# Patient Record
Sex: Male | Born: 1965 | Race: White | Hispanic: No | Marital: Married | Smoking: Former smoker
Health system: Southern US, Community
[De-identification: ages and names within clinical notes are randomized; demographics above are authoritative.]

## PROBLEM LIST (undated history)

## (undated) DIAGNOSIS — D751 Secondary polycythemia: Secondary | ICD-10-CM

## (undated) DIAGNOSIS — C569 Malignant neoplasm of unspecified ovary: Secondary | ICD-10-CM

## (undated) DIAGNOSIS — C801 Malignant (primary) neoplasm, unspecified: Secondary | ICD-10-CM

## (undated) HISTORY — DX: Secondary polycythemia: D75.1

## (undated) HISTORY — DX: Malignant neoplasm of unspecified ovary: C56.9

## (undated) HISTORY — DX: Malignant (primary) neoplasm, unspecified: C80.1

---

## 2017-06-07 DIAGNOSIS — Z6834 Body mass index (BMI) 34.0-34.9, adult: Secondary | ICD-10-CM | POA: Insufficient documentation

## 2017-06-07 DIAGNOSIS — I10 Essential (primary) hypertension: Secondary | ICD-10-CM | POA: Insufficient documentation

## 2017-06-07 DIAGNOSIS — D751 Secondary polycythemia: Secondary | ICD-10-CM | POA: Insufficient documentation

## 2017-06-27 ENCOUNTER — Ambulatory Visit
Admission: RE | Admit: 2017-06-27 | Discharge: 2017-06-27 | Disposition: A | Discharge status: Home / Self Care | Payer: Self-pay | Source: Ambulatory Visit | Attending: Oncology | Admitting: Oncology

## 2017-06-27 ENCOUNTER — Inpatient Hospital Stay: Payer: Self-pay | Attending: Oncology | Admitting: Oncology

## 2017-06-27 ENCOUNTER — Inpatient Hospital Stay: Payer: Self-pay

## 2017-06-27 ENCOUNTER — Encounter: Payer: Self-pay | Admitting: Oncology

## 2017-06-27 ENCOUNTER — Telehealth: Payer: Self-pay | Admitting: *Deleted

## 2017-06-27 ENCOUNTER — Other Ambulatory Visit: Payer: Self-pay | Admitting: *Deleted

## 2017-06-27 VITALS — BP 137/86 | HR 67 | Temp 98.3°F | Ht 72.0 in | Wt 246.9 lb

## 2017-06-27 VITALS — BP 119/87 | HR 109 | Temp 98.0°F | Resp 18

## 2017-06-27 DIAGNOSIS — D751 Secondary polycythemia: Secondary | ICD-10-CM

## 2017-06-27 DIAGNOSIS — R5383 Other fatigue: Secondary | ICD-10-CM | POA: Insufficient documentation

## 2017-06-27 DIAGNOSIS — R531 Weakness: Secondary | ICD-10-CM | POA: Insufficient documentation

## 2017-06-27 DIAGNOSIS — I10 Essential (primary) hypertension: Secondary | ICD-10-CM | POA: Insufficient documentation

## 2017-06-27 DIAGNOSIS — R350 Frequency of micturition: Secondary | ICD-10-CM | POA: Insufficient documentation

## 2017-06-27 DIAGNOSIS — Z87891 Personal history of nicotine dependence: Secondary | ICD-10-CM | POA: Insufficient documentation

## 2017-06-27 DIAGNOSIS — Z7982 Long term (current) use of aspirin: Secondary | ICD-10-CM | POA: Insufficient documentation

## 2017-06-27 DIAGNOSIS — L299 Pruritus, unspecified: Secondary | ICD-10-CM | POA: Insufficient documentation

## 2017-06-27 DIAGNOSIS — E785 Hyperlipidemia, unspecified: Secondary | ICD-10-CM | POA: Insufficient documentation

## 2017-06-27 DIAGNOSIS — Z809 Family history of malignant neoplasm, unspecified: Secondary | ICD-10-CM | POA: Insufficient documentation

## 2017-06-27 DIAGNOSIS — Z8041 Family history of malignant neoplasm of ovary: Secondary | ICD-10-CM | POA: Insufficient documentation

## 2017-06-27 DIAGNOSIS — E291 Testicular hypofunction: Secondary | ICD-10-CM | POA: Insufficient documentation

## 2017-06-27 LAB — CBC WITH DIFFERENTIAL/PLATELET
Basophils Absolute: 0.1 10*3/uL (ref 0–0.1)
Basophils Relative: 1 %
EOS ABS: 0.2 10*3/uL (ref 0–0.7)
EOS PCT: 3 %
HCT: 56.3 % — ABNORMAL HIGH (ref 40.0–52.0)
Hemoglobin: 19.4 g/dL — ABNORMAL HIGH (ref 13.0–18.0)
LYMPHS ABS: 1.9 10*3/uL (ref 1.0–3.6)
Lymphocytes Relative: 26 %
MCH: 27.9 pg (ref 26.0–34.0)
MCHC: 34.5 g/dL (ref 32.0–36.0)
MCV: 80.8 fL (ref 80.0–100.0)
MONOS PCT: 8 %
Monocytes Absolute: 0.6 10*3/uL (ref 0.2–1.0)
Neutro Abs: 4.6 10*3/uL (ref 1.4–6.5)
Neutrophils Relative %: 62 %
PLATELETS: 256 10*3/uL (ref 150–440)
RBC: 6.97 MIL/uL — AB (ref 4.40–5.90)
RDW: 14.4 % (ref 11.5–14.5)
WBC: 7.4 10*3/uL (ref 3.8–10.6)

## 2017-06-27 LAB — URINALYSIS, COMPLETE (UACMP) WITH MICROSCOPIC
BILIRUBIN URINE: NEGATIVE
Bacteria, UA: NONE SEEN
Glucose, UA: NEGATIVE mg/dL
HGB URINE DIPSTICK: NEGATIVE
Ketones, ur: NEGATIVE mg/dL
LEUKOCYTES UA: NEGATIVE
NITRITE: NEGATIVE
Protein, ur: NEGATIVE mg/dL
RBC / HPF: NONE SEEN RBC/hpf (ref 0–5)
SPECIFIC GRAVITY, URINE: 1.01 (ref 1.005–1.030)
Squamous Epithelial / HPF: NONE SEEN
pH: 6 (ref 5.0–8.0)

## 2017-06-27 NOTE — Progress Notes (Signed)
New patient with polycythemia here for initial evaluation. He states that he has severe itching on the back of his neck and arms. He states that he used to take testosterone replacement but he no longer uses it. He denies having any pain. He does not get the Flu Vaccine.

## 2017-06-27 NOTE — Progress Notes (Signed)
Hematology/Oncology Consult note [facility] Telephone:([phone] Fax:([phone]  Patient Care Team: Sofie Hartigan, MD as PCP - General (Family Medicine)   Name of the patient: Jerry Espinoza  [id number]  [birth year]    Reason for referral- Dr. Ellison Hughs   Referring physician- polycythemia  Date of visit: 06/27/17   History of presenting illness- patient is a 51 year old male who has been referred to [place] for evaluation and management of polycythemia. CBC done on 06/23/2017 showed white count of 7.4, H&H of 18.5/52.8 and platelet count 257. One month ago his H&H was 19.6/56.3. His past medical history is significant for hypertension and hyperlipidemia and hypogonadism. He is on testosterone on replacement therapy through a clinic in [place]. His hemoglobin has been between 17-18 units back in 2015. Serum free testosterone level on 06/07/2017 was low at0.89 and total testosterone was low at 37.   Patient states he has been off testosterone replacement for 3 months now. Reports itching mainly at the back of his scalp and over his trunk. Denies any exposure to new soaps or chemicals. He has also been started on BP meds for his HTN. He smoked for 29 years but quit smoking 10 years ago. He wakes up multiple times at night due to frequent urination and snores at night but often gets a restful sleep. He reports about 7-8 pounds of intentional weight loss over last few months  ECOG PS- 0  Pain scale- 0   Review of systems- Review of Systems  Constitutional: Positive for malaise/fatigue. Negative for chills, fever and weight loss.  HENT: Negative for congestion, ear discharge and nosebleeds.   Eyes: Negative for blurred vision.  Respiratory: Negative for cough, hemoptysis, sputum production, shortness of breath and wheezing.   Cardiovascular: Negative for chest pain, palpitations, orthopnea and claudication.  Gastrointestinal: Negative for abdominal pain,  blood in stool, constipation, diarrhea, heartburn, melena, nausea and vomiting.  Genitourinary: Negative for dysuria, flank pain, frequency, hematuria and urgency.  Musculoskeletal: Negative for back pain, joint pain and myalgias.  Skin: Positive for itching and rash.  Neurological: Negative for dizziness, tingling, focal weakness, seizures, weakness and headaches.  Endo/Heme/Allergies: Does not bruise/bleed easily.  Psychiatric/Behavioral: Negative for depression and suicidal ideas. The patient does not have insomnia.     No Known Allergies  Patient Active Problem List   Diagnosis Date Noted  . Hypogonadism male 06/27/2017  . Essential hypertension 06/07/2017  . Polycythemia 06/07/2017     Past Medical History:  Diagnosis Date  . Cancer ([place])   . Ovarian cancer [facility])      History reviewed. No pertinent surgical history.  Social History   Social History  . Marital status: Married    Spouse name: N/A  . Number of children: N/A  . Years of education: N/A   Occupational History  . Not on file.   Social History Main Topics  . Smoking status: Former Smoker    Packs/day: 1.50    Years: 29.00    Quit date: 09/07/2007  . Smokeless tobacco: Never Used  . Alcohol use Not on file  . Drug use: No  . Sexual activity: Not on file   Other Topics Concern  . Not on file   Social History Narrative  . No narrative on file     Family History  Problem Relation Age of Onset  . Cancer Father   . Cancer Sister      Current Outpatient Prescriptions:  .  aspirin EC 81 MG tablet,  Take 81 mg by mouth daily., Disp: , Rfl:  .  lisinopril-hydrochlorothiazide (PRINZIDE,ZESTORETIC) 10-12.5 MG tablet, Take 1 tablet by mouth daily., Disp: , Rfl: 3   Physical exam:  Vitals:   06/27/17 0910 06/27/17 0914  BP:  137/86  Pulse:  67  Temp:  98.3 F (36.8 C)  TempSrc:  Tympanic  Weight: 246 lb 14.6 oz (112 kg) 246 lb 14.6 oz (112 kg)  Height:  6' (1.829 m)   Physical Exam    Constitutional: He is oriented to person, place, and time and well-developed, well-nourished, and in no distress.  Face appears mildly plethoric  HENT:  Head: Normocephalic and atraumatic.  Eyes: Pupils are equal, round, and reactive to light. EOM are normal.  Neck: Normal range of motion.  Cardiovascular: Normal rate, regular rhythm and normal heart sounds.   Pulmonary/Chest: Effort normal and breath sounds normal.  Abdominal: Soft. Bowel sounds are normal.  No palpable splenomegaly  Neurological: He is alert and oriented to person, place, and time.  Skin: Skin is warm and dry.  Erythematous macular rash seen over the trunk.        Assessment and plan- Patient is a 51 y.o. male referred for polycythemia  Patient is currently off testosterone replacement therapy for last 3 months. Serum testosterone levels checked last week were low. His hb continues to be high despite stopping testosterone. I would therefore like to rule out primary polycythemia vera- check cbc with diff, epo level, JAK2 mutation testing. r/o secondary causes CXR (chronic lung disease), tumor associated polycythemia (EPO and UA).   Depending on his H/H today, I will consider doing weekly phlebotomy if hct >50. I will see him back in 2 weeks time to discuss his bloodwork results. If testing for polycythemia vera negative, he will need OSA testing by pcp. He has been referred to endocrinology by PCP for polycythemia  I explained to him difference between primary and secondary polycythemia,e tiology and management   Thank you for this kind referral and the opportunity to participate in the care of this patient  Visit Diagnosis 1. Polycythemia     Dr. Randa Evens, MD, MPH [facility] at [facility] Pager- [phone] 06/27/2017  10:54 AM

## 2017-06-27 NOTE — Telephone Encounter (Signed)
Called the patient and told him that hct is elevated still after he has been off testosterone.  With hct being elevated pt will need phlebtomy this week and next week and he is agreeable.  Today he will come back at 2 pm and we will decide what date next week so her can look at his schedule

## 2017-06-27 NOTE — Patient Instructions (Signed)

## 2017-06-28 LAB — ERYTHROPOIETIN: ERYTHROPOIETIN: 5.6 m[IU]/mL (ref 2.6–18.5)

## 2017-06-30 ENCOUNTER — Encounter: Payer: Self-pay | Admitting: Family Medicine

## 2017-06-30 LAB — JAK2 GENOTYPR

## 2017-07-04 ENCOUNTER — Inpatient Hospital Stay: Payer: Self-pay

## 2017-07-04 DIAGNOSIS — D751 Secondary polycythemia: Secondary | ICD-10-CM

## 2017-07-04 LAB — CBC
HEMATOCRIT: 52.6 % — AB (ref 40.0–52.0)
Hemoglobin: 18.1 g/dL — ABNORMAL HIGH (ref 13.0–18.0)
MCH: 27.9 pg (ref 26.0–34.0)
MCHC: 34.4 g/dL (ref 32.0–36.0)
MCV: 81.1 fL (ref 80.0–100.0)
PLATELETS: 218 10*3/uL (ref 150–440)
RBC: 6.48 MIL/uL — AB (ref 4.40–5.90)
RDW: 14 % (ref 11.5–14.5)
WBC: 7.8 10*3/uL (ref 3.8–10.6)

## 2017-07-11 ENCOUNTER — Inpatient Hospital Stay: Payer: Self-pay

## 2017-07-11 ENCOUNTER — Encounter: Payer: Self-pay | Admitting: Oncology

## 2017-07-11 ENCOUNTER — Other Ambulatory Visit: Payer: Self-pay | Admitting: *Deleted

## 2017-07-11 ENCOUNTER — Inpatient Hospital Stay: Payer: Self-pay | Attending: Oncology | Admitting: Oncology

## 2017-07-11 DIAGNOSIS — Z8041 Family history of malignant neoplasm of ovary: Secondary | ICD-10-CM | POA: Insufficient documentation

## 2017-07-11 DIAGNOSIS — E291 Testicular hypofunction: Secondary | ICD-10-CM | POA: Insufficient documentation

## 2017-07-11 DIAGNOSIS — Z79899 Other long term (current) drug therapy: Secondary | ICD-10-CM | POA: Insufficient documentation

## 2017-07-11 DIAGNOSIS — Z807 Family history of other malignant neoplasms of lymphoid, hematopoietic and related tissues: Secondary | ICD-10-CM | POA: Insufficient documentation

## 2017-07-11 DIAGNOSIS — D751 Secondary polycythemia: Secondary | ICD-10-CM | POA: Insufficient documentation

## 2017-07-11 DIAGNOSIS — I10 Essential (primary) hypertension: Secondary | ICD-10-CM | POA: Insufficient documentation

## 2017-07-11 DIAGNOSIS — R350 Frequency of micturition: Secondary | ICD-10-CM | POA: Insufficient documentation

## 2017-07-11 DIAGNOSIS — L299 Pruritus, unspecified: Secondary | ICD-10-CM | POA: Insufficient documentation

## 2017-07-11 DIAGNOSIS — Z87891 Personal history of nicotine dependence: Secondary | ICD-10-CM | POA: Insufficient documentation

## 2017-07-11 DIAGNOSIS — E785 Hyperlipidemia, unspecified: Secondary | ICD-10-CM | POA: Insufficient documentation

## 2017-07-11 DIAGNOSIS — Z808 Family history of malignant neoplasm of other organs or systems: Secondary | ICD-10-CM | POA: Insufficient documentation

## 2017-07-11 LAB — CBC
HCT: 48.7 % (ref 40.0–52.0)
Hemoglobin: 16.9 g/dL (ref 13.0–18.0)
MCH: 28.3 pg (ref 26.0–34.0)
MCHC: 34.6 g/dL (ref 32.0–36.0)
MCV: 81.6 fL (ref 80.0–100.0)
Platelets: 235 10*3/uL (ref 150–440)
RBC: 5.97 MIL/uL — AB (ref 4.40–5.90)
RDW: 14.3 % (ref 11.5–14.5)
WBC: 10.8 10*3/uL — AB (ref 3.8–10.6)

## 2017-07-11 NOTE — Progress Notes (Signed)
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 CBC done on 06/23/2017 showed white count of 7.4, H&H of 18.5/52.8 and platelet count 257. One month ago his H&H was 19.6/56.3. His past medical history is significant for hypertension and hyperlipidemia and hypogonadism. He is on testosterone on replacement therapy through a clinic in [place]. His hemoglobin has been between 17-18 units back in 2015. Serum free testosterone level on 06/07/2017 was low at0.89 and total testosterone was low at 37.   Patient states he has been off testosterone replacement for 3 months now. Reports itching mainly at the back of his scalp and over his trunk. Denies any exposure to new soaps or chemicals. He has also been started on BP meds for his HTN. He smoked for 29 years but quit smoking 10 years ago. He wakes up multiple times at night due to frequent urination and snores at night but often gets a restful sleep. He reports about 7-8 pounds of intentional weight loss over last few months  Results of blood work from 06/27/2017 were as follows: CBC showed a normal white count and the platelet count and H&H was elevated at 19.4/56.3 respectively.  EPO levels were normal at 5.6.  Jak 2 mutation testing was negative.  Urinalysis did not reveal any evidence of hematuria.  Chest x-ray showed no active cardiopulmonary disease  Given patient's elevated despite stopping testosterone, phlebotomy was initiated on 06/27/2017 and  he has had 2 sessions so far   Interval history- Patient states he has lost 26 pounds of weight in the last 3 weeks although weight on our record has not changed since 06/27/17. Beginning of October he has 257 pounds. 10 pound weight loss but he has also had his tooth pulled in the interim  ECOG PS- 0 Pain scale- 0   Review of systems- Review of Systems  Constitutional: Positive for malaise/fatigue and weight loss. Negative for chills and fever.  HENT: Negative for congestion, ear discharge and nosebleeds.   Eyes: Negative for blurred vision.  Respiratory: Negative for cough, hemoptysis, sputum production, shortness of breath and wheezing.   Cardiovascular: Negative for chest pain, palpitations, orthopnea and claudication.  Gastrointestinal: Negative for abdominal pain, blood in stool, constipation, diarrhea, heartburn, melena, nausea and vomiting.  Genitourinary: Negative for dysuria, flank pain, frequency, hematuria and urgency.  Musculoskeletal: Negative for back pain, joint pain and myalgias.  Skin: Negative for rash.  Neurological: Negative for dizziness, tingling, focal weakness, seizures, weakness and headaches.  Endo/Heme/Allergies: Does not bruise/bleed easily.  Psychiatric/Behavioral: Negative for depression and suicidal ideas. The patient does not have insomnia.       No Known Allergies   Past Medical History:  Diagnosis Date  . Cancer ([place])   . Ovarian cancer ([place])      No past surgical history on file.  Social History   Socioeconomic History  . Marital status: Married    Spouse name: Not on file  . Number of children: Not on file  . Years of education: Not on file  . Highest education  level: Not on file  Social Needs  . Financial resource strain: Not on file  . Food insecurity - worry: Not on file  . Food insecurity - inability: Not on file  . Transportation needs - medical: Not on file  . Transportation needs - non-medical: Not on file  Occupational  History  . Not on file  Tobacco Use  . Smoking status: Former Smoker    Packs/day: 1.50    Years: 29.00    Pack years: 43.50    Last attempt to quit: 09/06/2008    Years since quitting: 8.8  . Smokeless tobacco: Never Used  Substance and Sexual Activity  . Alcohol use: Not on file  . Drug use: No  . Sexual activity: Not on file  Other Topics Concern  . Not on file  Social History Narrative  . Not on file    Family History  Problem Relation Age of Onset  . Cancer Father   . Cancer Sister      Current Outpatient Medications:  .  aspirin EC 81 MG tablet, Take 81 mg by mouth daily., Disp: , Rfl:  .  lisinopril-hydrochlorothiazide (PRINZIDE,ZESTORETIC) 10-12.5 MG tablet, Take 1 tablet by mouth daily., Disp: , Rfl: 3  Physical exam:  Vitals:   07/11/17 0834 07/11/17 0838  BP:  122/83  Pulse:  70  Resp: 14   Temp: 99.7 F (37.6 C)   TempSrc: Tympanic   Weight: 246 lb 7.6 oz (111.8 kg)    Physical Exam  Constitutional: He is oriented to person, place, and time and well-developed, well-nourished, and in no distress.  HENT:  Head: Normocephalic and atraumatic.  Eyes: EOM are normal. Pupils are equal, round, and reactive to light.  Neck: Normal range of motion.  Cardiovascular: Normal rate, regular rhythm and normal heart sounds.  Pulmonary/Chest: Effort normal and breath sounds normal.  Abdominal: Soft. Bowel sounds are normal.  Neurological: He is alert and oriented to person, place, and time.  Skin: Skin is warm and dry.     No flowsheet data found. CBC Latest Ref Rng & Units 07/11/2017  WBC 3.8 - 10.6 K/uL 10.8(H)  Hemoglobin 13.0 - 18.0 g/dL 16.9  Hematocrit 40.0 - 52.0 % 48.7  Platelets 150 - 440 K/uL 235    No images are attached to the encounter.  Dg Chest 2 View  Result Date: 06/27/2017 CLINICAL DATA:  Polycythemia EXAM: CHEST  2 VIEW COMPARISON:  None. FINDINGS: Heart and mediastinal contours are within normal limits. No focal opacities or effusions. No  acute bony abnormality. IMPRESSION: No active cardiopulmonary disease. Electronically Signed   By: Rolm Baptise M.D.   On: 06/27/2017 10:41     Assessment and plan- Patient is a 51 y.o. male referred for polycythemia likely secondary to testosterone replacement therapy  Patient has been off testosterone for about 3 months now and his H&H remains elevated.  Serum testosterone levels were confirmed to be low in October 2018.  I also reviewed the results of the blood work with the patient today which shows a normal EPO level and no evidence of Jak 2 mutation which argues against primary polycythemia.  Chest x-ray was negative for any chronic lung disease and urinalysis did not reveal any hematuria.  Given the normal EPO level tumor associated polycythemia is also unlikely.  At this time I will continue phlebotomy every 2 weeks until the hematocrit is less than 50.  Will repeat CBC with differential and serum testosterone levels in 1 months time  and I will see him back in 3 months with cbc with diff and serum testosterone levels.  If this is truly due to testosterone replacement therapy and patient continues to remain off testosterone, his H&H should normalized with time.   If H&H remains elevated over the next few months despite a low serum testosterone level, I will consider doing a bone marrow biopsy at that time. But he will need sleep apnea evaluation prior  Patient seeing endocrinology next month. If he is re initiated on testosterone, he will need to be started on lowest possible dose to alleviate his symptoms. If his H/H remains >52-54 despite lowest doses of testosterone, I will restart phlebotomy at that time. No indication for phlebotomy today  Patient reports h/o varian carcncer in his sister. Father had lymphoma and paternal uncle has brain cancer. These taken together do not fit any particular genetic pattern. However patient is very worried about family h/o multipel cancers and I will  therefore refer him to genetic counseeling for the same. He will need a screening colonoscopy at this time which should be arranged by his pcp   Visit Diagnosis 1. Polycythemia, secondary      Dr. Randa Evens, MD, MPH [facility] at [facility] Pager- [phone] 07/11/2017 9:27 AM

## 2017-07-11 NOTE — Progress Notes (Signed)
Patient here for follow up with lab results today. He states that he has had a cold for a few days and is very congested. Otherwise, he feels ok.

## 2017-08-08 ENCOUNTER — Other Ambulatory Visit: Payer: Self-pay

## 2017-08-10 ENCOUNTER — Inpatient Hospital Stay: Payer: Self-pay | Attending: Oncology

## 2017-08-10 ENCOUNTER — Telehealth: Payer: Self-pay | Admitting: *Deleted

## 2017-08-10 DIAGNOSIS — D751 Secondary polycythemia: Secondary | ICD-10-CM | POA: Insufficient documentation

## 2017-08-10 LAB — CBC
HEMATOCRIT: 50 % (ref 40.0–52.0)
Hemoglobin: 17.2 g/dL (ref 13.0–18.0)
MCH: 28.6 pg (ref 26.0–34.0)
MCHC: 34.3 g/dL (ref 32.0–36.0)
MCV: 83.5 fL (ref 80.0–100.0)
PLATELETS: 223 10*3/uL (ref 150–440)
RBC: 5.99 MIL/uL — ABNORMAL HIGH (ref 4.40–5.90)
RDW: 14.3 % (ref 11.5–14.5)
WBC: 6.3 10*3/uL (ref 3.8–10.6)

## 2017-08-10 NOTE — Telephone Encounter (Signed)
Called pt and let him know that he did not need phlebotomy today.  Pt has next f/u appt 10/17/2017.  He was thankful for call

## 2017-08-10 NOTE — Telephone Encounter (Signed)
-----   Message from Sindy Guadeloupe, MD sent at 08/10/2017  8:50 AM EST ----- No phlebotomy.

## 2017-08-11 LAB — TESTOSTERONE: Testosterone: 110 ng/dL — ABNORMAL LOW (ref 264–916)

## 2017-10-10 ENCOUNTER — Other Ambulatory Visit: Payer: Self-pay

## 2017-10-10 ENCOUNTER — Ambulatory Visit: Payer: Self-pay | Admitting: Oncology

## 2017-10-17 ENCOUNTER — Encounter: Payer: Self-pay | Admitting: Oncology

## 2017-10-17 ENCOUNTER — Inpatient Hospital Stay: Payer: Self-pay

## 2017-10-17 ENCOUNTER — Inpatient Hospital Stay: Payer: Self-pay | Attending: Oncology | Admitting: Oncology

## 2017-10-17 VITALS — BP 116/95 | HR 75 | Temp 98.5°F | Ht 72.0 in | Wt 249.3 lb

## 2017-10-17 VITALS — BP 134/93 | HR 82 | Temp 97.6°F | Resp 18

## 2017-10-17 DIAGNOSIS — L299 Pruritus, unspecified: Secondary | ICD-10-CM

## 2017-10-17 DIAGNOSIS — I10 Essential (primary) hypertension: Secondary | ICD-10-CM

## 2017-10-17 DIAGNOSIS — R351 Nocturia: Secondary | ICD-10-CM

## 2017-10-17 DIAGNOSIS — Z809 Family history of malignant neoplasm, unspecified: Secondary | ICD-10-CM

## 2017-10-17 DIAGNOSIS — Z7982 Long term (current) use of aspirin: Secondary | ICD-10-CM

## 2017-10-17 DIAGNOSIS — D751 Secondary polycythemia: Secondary | ICD-10-CM

## 2017-10-17 DIAGNOSIS — E291 Testicular hypofunction: Secondary | ICD-10-CM

## 2017-10-17 DIAGNOSIS — Z79899 Other long term (current) drug therapy: Secondary | ICD-10-CM

## 2017-10-17 DIAGNOSIS — R5383 Other fatigue: Secondary | ICD-10-CM

## 2017-10-17 DIAGNOSIS — E785 Hyperlipidemia, unspecified: Secondary | ICD-10-CM

## 2017-10-17 DIAGNOSIS — Z87891 Personal history of nicotine dependence: Secondary | ICD-10-CM

## 2017-10-17 LAB — CBC
HCT: 49.5 % (ref 40.0–52.0)
HEMOGLOBIN: 17.4 g/dL (ref 13.0–18.0)
MCH: 29.6 pg (ref 26.0–34.0)
MCHC: 35.2 g/dL (ref 32.0–36.0)
MCV: 84 fL (ref 80.0–100.0)
PLATELETS: 244 10*3/uL (ref 150–440)
RBC: 5.89 MIL/uL (ref 4.40–5.90)
RDW: 12.9 % (ref 11.5–14.5)
WBC: 6.7 10*3/uL (ref 3.8–10.6)

## 2017-10-17 NOTE — Progress Notes (Signed)
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 CBC done on 06/23/2017 showed white count of 7.4, H&H of 18.5/52.8 and platelet count 257. One month ago his H&H was 19.6/56.3. His past medical history is significant for hypertension and hyperlipidemia and hypogonadism. He is on testosterone on replacement therapy through a clinic in [place]. His hemoglobin has been between 17-18 units back in 2015. Serum free testosterone level on 06/07/2017 was low at0.89 and total testosterone was low at 37.  Patient states he has been off testosterone replacement for 3 months now. Reports itching mainly at the back of his scalp and over his trunk. Denies any exposure to new soaps or chemicals. He has also been started on BP meds for his HTN. He smoked for 29 years but quit smoking 10 years ago. He wakes up multiple times at night due to frequent urination and snores at night but often gets a restful sleep. He reports about 7-8 pounds of intentional weight loss over last few months  Results of blood work from 06/27/2017 were as follows: CBC showed a normal white count and the platelet count and H&H was elevated at 19.4/56.3 respectively.  EPO levels were normal at 5.6.  Jak 2 mutation testing was negative.  Urinalysis did not reveal any evidence of hematuria.  Chest x-ray showed no active cardiopulmonary disease  Given patient's elevated despite stopping testosterone,  phlebotomy was initiated on 06/27/2017 and he has had 2 sessions so far   Interval history- reports mild fatigue. Denies other complaints. He has not seen endocrinology yet. Remains off testosterone  ECOG PS- 0 Pain scale- 0   Review of systems- Review of Systems  Constitutional: Positive for malaise/fatigue. Negative for chills, fever and weight loss.  HENT: Negative for congestion, ear discharge and nosebleeds.   Eyes: Negative for blurred vision.  Respiratory: Negative for cough, hemoptysis, sputum production, shortness of breath and wheezing.   Cardiovascular: Negative for chest pain, palpitations, orthopnea and claudication.  Gastrointestinal: Negative for abdominal pain, blood in stool, constipation, diarrhea, heartburn, melena, nausea and vomiting.  Genitourinary: Negative for dysuria, flank pain, frequency, hematuria and urgency.  Musculoskeletal: Negative for back pain, joint pain and myalgias.  Skin: Negative for rash.  Neurological: Negative for dizziness, tingling, focal weakness, seizures, weakness and headaches.  Endo/Heme/Allergies: Does not bruise/bleed easily.  Psychiatric/Behavioral: Negative for depression and suicidal ideas. The patient does not have insomnia.       No Known Allergies   Past Medical History:  Diagnosis Date  . Cancer ([place])   . Ovarian cancer [facility])      History reviewed. No pertinent surgical history.  Social History   Socioeconomic History  . Marital status: Married    Spouse name: Not on file  . Number of children: Not on file  . Years of education: Not on file  . Highest education level: Not on file  Social Needs  . Financial resource strain: Not on file  . Food insecurity - worry: Not on file  . Food  insecurity - inability: Not on file  . Transportation needs - medical: Not on file  . Transportation needs - non-medical: Not on file  Occupational History  . Not on file  Tobacco Use  . Smoking status: Former Smoker     Packs/day: 1.50    Years: 29.00    Pack years: 43.50    Last attempt to quit: 09/06/2008    Years since quitting: 9.1  . Smokeless tobacco: Never Used  Substance and Sexual Activity  . Alcohol use: Not on file  . Drug use: No  . Sexual activity: Not on file  Other Topics Concern  . Not on file  Social History Narrative  . Not on file    Family History  Problem Relation Age of Onset  . Cancer Father   . Cancer Sister      Current Outpatient Medications:  .  aspirin EC 81 MG tablet, Take 81 mg by mouth daily., Disp: , Rfl:  .  lisinopril-hydrochlorothiazide (PRINZIDE,ZESTORETIC) 10-12.5 MG tablet, Take 1 tablet by mouth daily., Disp: , Rfl: 3  Physical exam:  Vitals:   10/17/17 0829  BP: (!) 116/95  Pulse: 75  Temp: 98.5 F (36.9 C)  TempSrc: Oral  Weight: 249 lb 5.4 oz (113.1 kg)  Height: 6' (1.829 m)   Physical Exam  Constitutional: He is oriented to person, place, and time and well-developed, well-nourished, and in no distress.  HENT:  Head: Normocephalic and atraumatic.  Eyes: EOM are normal. Pupils are equal, round, and reactive to light.  Neck: Normal range of motion.  Cardiovascular: Normal rate, regular rhythm and normal heart sounds.  Pulmonary/Chest: Effort normal and breath sounds normal.  Abdominal: Soft. Bowel sounds are normal.  Neurological: He is alert and oriented to person, place, and time.  Skin: Skin is warm and dry.     No flowsheet data found. CBC Latest Ref Rng & Units 10/17/2017  WBC 3.8 - 10.6 K/uL 6.7  Hemoglobin 13.0 - 18.0 g/dL 17.4  Hematocrit 40.0 - 52.0 % 49.5  Platelets 150 - 440 K/uL 244    No images are attached to the encounter.  No results found.   Assessment and plan- Patient is a 52 y.o. 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  Today's hematocrit is 49.5 and he is borderline for need of phlebotomy.  I will however proceed  with phlebotomy today.  Hopefully he would not need further phlebotomy sessions in the future now that he has been off testosterone for a while.  Repeat CBC in 6 weeks and 12 weeks and I will see him back in 12 weeks for possible phlebotomy.  His E Po levels were normal and Jak 2 mutation was negative and he therefore does not have primary polycythemia vera  I will also touch base with endocrinology regarding his referral that was made by his primary care doctor.  If patient needs to be on testosterone replacements in the future he may need phlebotomies more often   Visit Diagnosis 1. Polycythemia, secondary      Dr. Randa Evens, MD, MPH [facility] at [facility] Pager- [phone] 10/17/2017 8:45 AM

## 2017-10-17 NOTE — Progress Notes (Signed)
No new changes

## 2017-10-17 NOTE — Addendum Note (Signed)
Addended by: Luella Cook on: 10/17/2017 09:01 AM   Modules accepted: Orders

## 2017-10-17 NOTE — Patient Instructions (Signed)

## 2017-10-18 LAB — TESTOSTERONE: TESTOSTERONE: 98 ng/dL — AB (ref 264–916)

## 2017-11-28 ENCOUNTER — Inpatient Hospital Stay: Payer: Self-pay | Admitting: Oncology

## 2017-11-28 ENCOUNTER — Inpatient Hospital Stay: Payer: Self-pay | Attending: Oncology

## 2017-11-28 ENCOUNTER — Inpatient Hospital Stay: Payer: Self-pay

## 2017-11-28 DIAGNOSIS — D751 Secondary polycythemia: Secondary | ICD-10-CM | POA: Insufficient documentation

## 2017-11-28 LAB — CBC
HCT: 48.7 % (ref 40.0–52.0)
HEMOGLOBIN: 16.8 g/dL (ref 13.0–18.0)
MCH: 29.2 pg (ref 26.0–34.0)
MCHC: 34.4 g/dL (ref 32.0–36.0)
MCV: 84.9 fL (ref 80.0–100.0)
Platelets: 230 10*3/uL (ref 150–440)
RBC: 5.73 MIL/uL (ref 4.40–5.90)
RDW: 12.8 % (ref 11.5–14.5)
WBC: 5.8 10*3/uL (ref 3.8–10.6)

## 2017-12-29 IMAGING — CR DG CHEST 2V
2 series · 2 of 2 positions shown · non-contrast
Comparison: None.

CLINICAL DATA: Polycythemia

EXAM:
CHEST  2 VIEW

[chest pa]
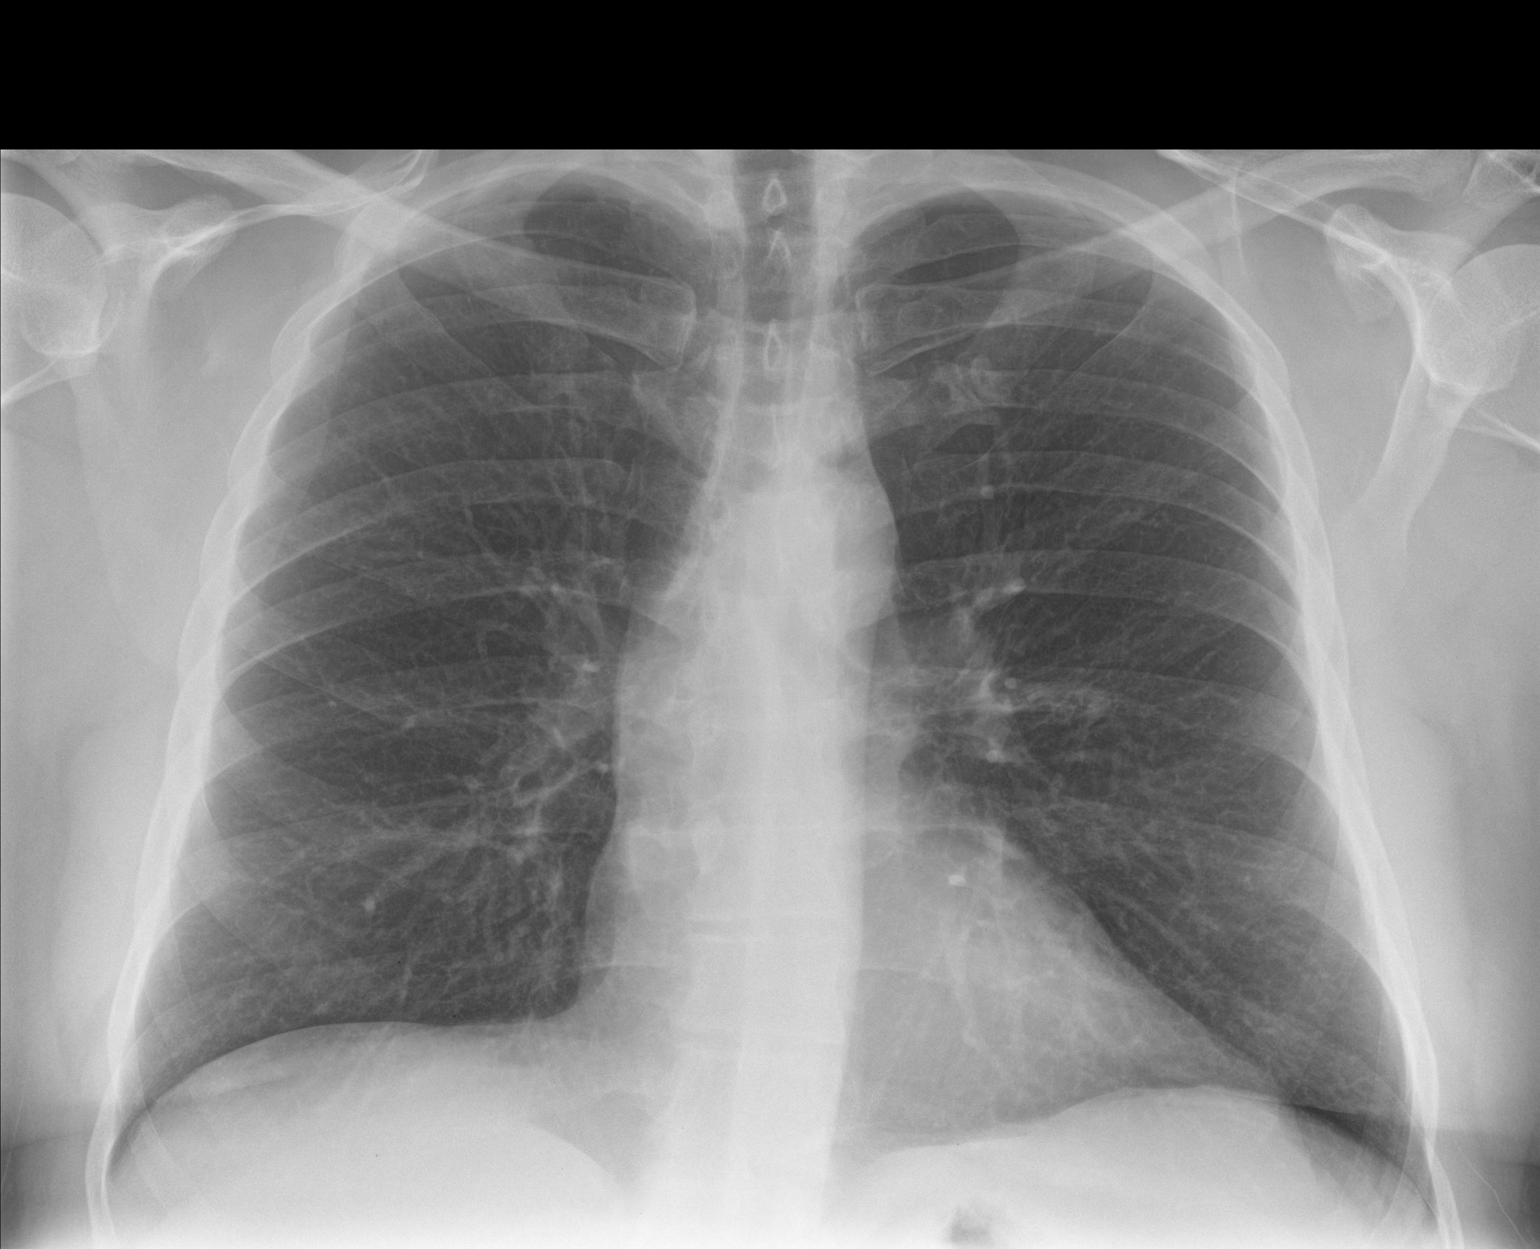

[chest lat]
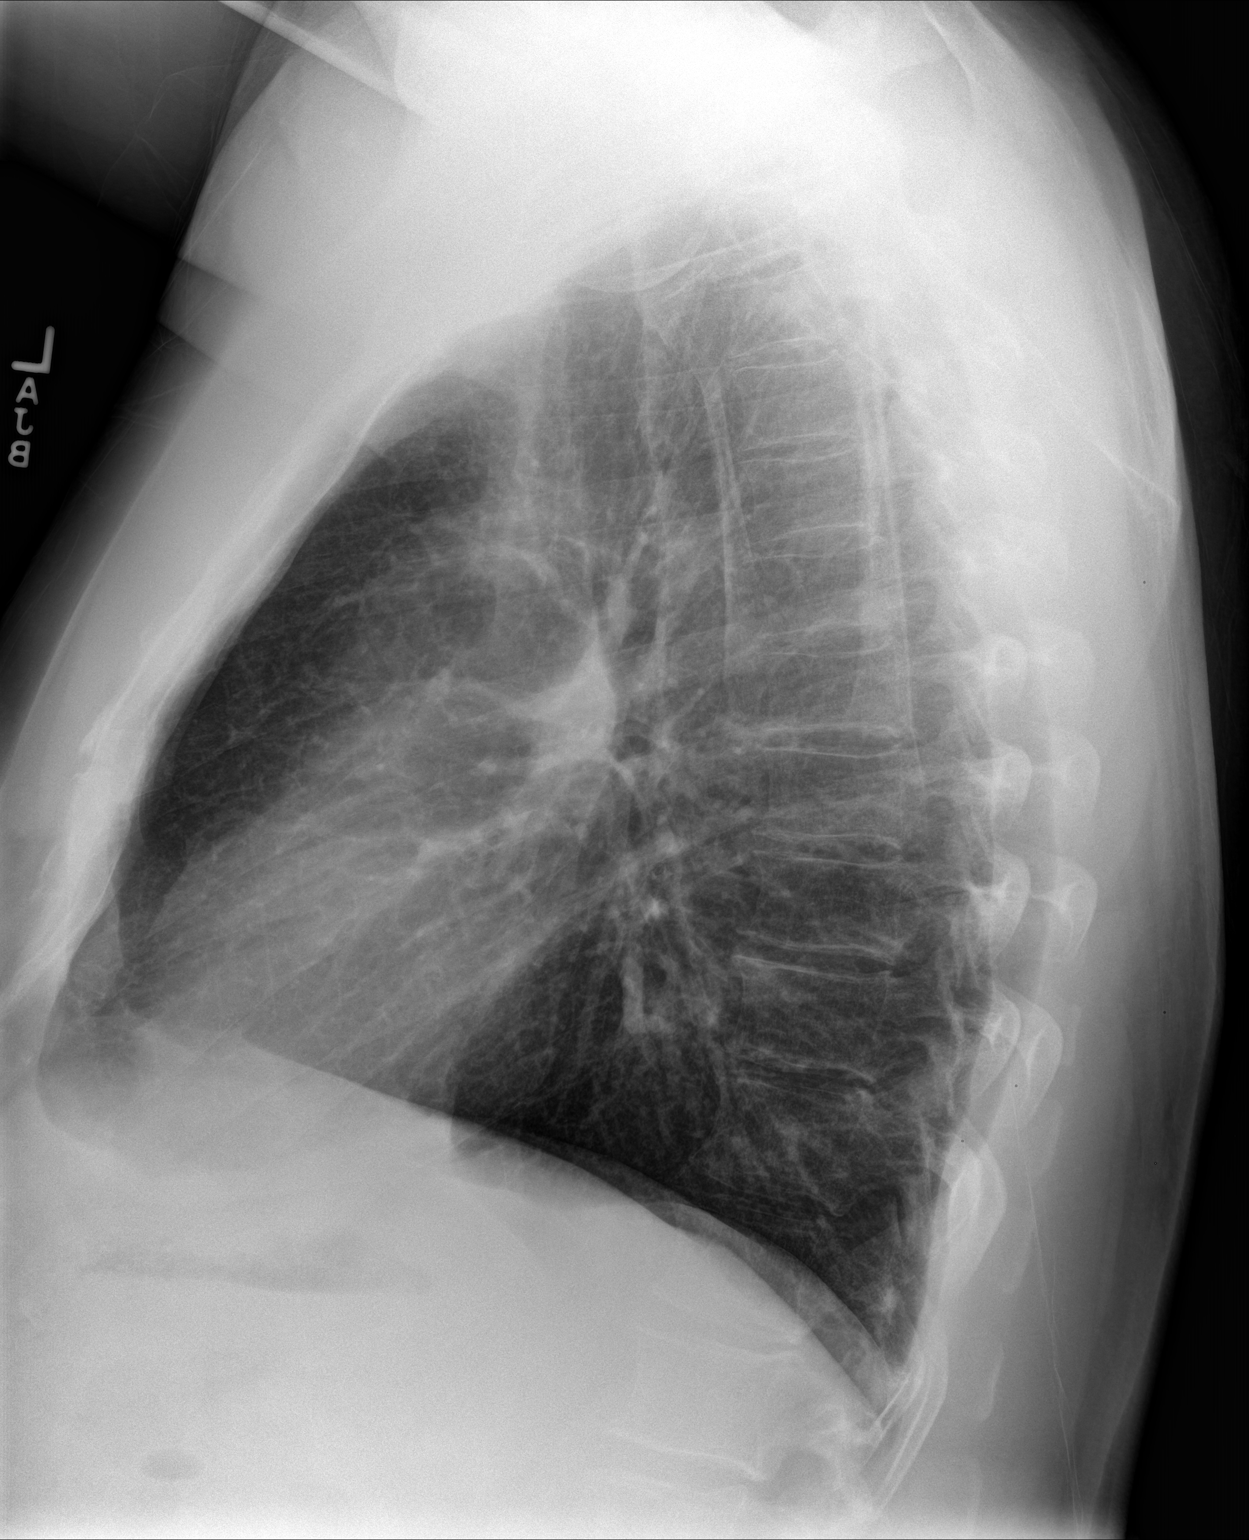

[2 of 2 positions shown; findings below may reference images not displayed]

FINDINGS: Heart and mediastinal contours are within normal limits. No focal
opacities or effusions. No acute bony abnormality.
IMPRESSION: No active cardiopulmonary disease.

## 2018-01-09 ENCOUNTER — Encounter: Payer: Self-pay | Admitting: Oncology

## 2018-01-09 ENCOUNTER — Inpatient Hospital Stay: Payer: Self-pay | Attending: Oncology | Admitting: Oncology

## 2018-01-09 ENCOUNTER — Inpatient Hospital Stay: Payer: Self-pay

## 2018-01-09 ENCOUNTER — Telehealth: Payer: Self-pay | Admitting: *Deleted

## 2018-01-09 VITALS — BP 120/86 | HR 74 | Temp 97.8°F | Resp 18 | Ht 72.0 in | Wt 250.6 lb

## 2018-01-09 DIAGNOSIS — R51 Headache: Secondary | ICD-10-CM | POA: Insufficient documentation

## 2018-01-09 DIAGNOSIS — E785 Hyperlipidemia, unspecified: Secondary | ICD-10-CM | POA: Insufficient documentation

## 2018-01-09 DIAGNOSIS — Z87891 Personal history of nicotine dependence: Secondary | ICD-10-CM | POA: Insufficient documentation

## 2018-01-09 DIAGNOSIS — Z809 Family history of malignant neoplasm, unspecified: Secondary | ICD-10-CM | POA: Insufficient documentation

## 2018-01-09 DIAGNOSIS — Z79899 Other long term (current) drug therapy: Secondary | ICD-10-CM | POA: Insufficient documentation

## 2018-01-09 DIAGNOSIS — E291 Testicular hypofunction: Secondary | ICD-10-CM | POA: Insufficient documentation

## 2018-01-09 DIAGNOSIS — D751 Secondary polycythemia: Secondary | ICD-10-CM | POA: Insufficient documentation

## 2018-01-09 DIAGNOSIS — R351 Nocturia: Secondary | ICD-10-CM | POA: Insufficient documentation

## 2018-01-09 DIAGNOSIS — T387X5D Adverse effect of androgens and anabolic congeners, subsequent encounter: Secondary | ICD-10-CM | POA: Insufficient documentation

## 2018-01-09 DIAGNOSIS — Z7982 Long term (current) use of aspirin: Secondary | ICD-10-CM | POA: Insufficient documentation

## 2018-01-09 DIAGNOSIS — I10 Essential (primary) hypertension: Secondary | ICD-10-CM | POA: Insufficient documentation

## 2018-01-09 LAB — CBC
HCT: 50.6 % (ref 40.0–52.0)
HEMOGLOBIN: 17.3 g/dL (ref 13.0–18.0)
MCH: 28.8 pg (ref 26.0–34.0)
MCHC: 34.1 g/dL (ref 32.0–36.0)
MCV: 84.4 fL (ref 80.0–100.0)
Platelets: 257 10*3/uL (ref 150–440)
RBC: 6 MIL/uL — AB (ref 4.40–5.90)
RDW: 13.2 % (ref 11.5–14.5)
WBC: 6.8 10*3/uL (ref 3.8–10.6)

## 2018-01-09 NOTE — Progress Notes (Signed)
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 CBC done on 06/23/2017 showed white count of 7.4, H&H of 18.5/52.8 and platelet count 257. One month ago his H&H was 19.6/56.3. His past medical history is significant for hypertension and hyperlipidemia and hypogonadism. He is on testosterone on replacement therapy through a clinic in [place]. His hemoglobin has been between 17-18 units back in 2015. Serum free testosterone level on 06/07/2017 was low at0.89 and total testosterone was low at 37.  Patient states he has been off testosterone replacement for 3 months now. Reports itching mainly at the back of his scalp and over his trunk. Denies any exposure to new soaps or chemicals. He has also been started on BP meds for his HTN. He smoked for 29 years but quit smoking 10 years ago. He wakes up multiple times at night due to frequent urination and snores at night but often gets a restful sleep. He reports about 7-8 pounds of intentional weight loss over last few months  Results of blood work from 06/27/2017 were as follows: CBC showed a normal white count and the platelet count and H&H was elevated at 19.4/56.3 respectively.EPOlevels were normal at 5.6. Jak 2 mutation testing was negative. Urinalysis did not reveal any evidence of hematuria. Chest x-ray showed no active cardiopulmonary disease  Given patient's elevated despite stopping testosterone,  phlebotomy was initiated on 06/27/2017  Interval history- continues to have on and off headaches which makes it difficult for him to sleep. This is being worked up by Dr. Ellison Hughs. Reports itching in his extremities is better but has scalp itching. He remains off testosterone  ECOG PS- 0 Pain scale- 0   Review of systems- Review of Systems  Constitutional: Negative for chills, fever, malaise/fatigue and weight loss.  HENT: Negative for congestion, ear discharge and nosebleeds.   Eyes: Negative for blurred vision.  Respiratory: Negative for cough, hemoptysis, sputum production, shortness of breath and wheezing.   Cardiovascular: Negative for chest pain, palpitations, orthopnea and claudication.  Gastrointestinal: Negative for abdominal pain, blood in stool, constipation, diarrhea, heartburn, melena, nausea and vomiting.  Genitourinary: Negative for dysuria, flank pain, frequency, hematuria and urgency.  Musculoskeletal: Negative for back pain, joint pain and myalgias.  Skin: Positive for itching. Negative for rash.  Neurological: Positive for headaches. Negative for dizziness, tingling, focal weakness, seizures and weakness.  Endo/Heme/Allergies: Does not bruise/bleed easily.  Psychiatric/Behavioral: Negative for depression and suicidal ideas. The patient does not have insomnia.      No Known Allergies   Past Medical History:  Diagnosis Date  . Cancer ([place])   . Ovarian cancer ([place])      No past surgical history on file.  Social History   Socioeconomic History  . Marital status: Married    Spouse name: Not on file  . Number of children: Not on file  . Years of education: Not on file  . Highest education level: Not on file  Occupational History  . Not on file  Social Needs  . Emergency planning/management officer  strain: Not on file  . Food insecurity:    Worry: Not on file    Inability: Not on file  . Transportation needs:    Medical: Not on file    Non-medical: Not on file  Tobacco  Use  . Smoking status: Former Smoker    Packs/day: 1.50    Years: 29.00    Pack years: 43.50    Last attempt to quit: 09/06/2008    Years since quitting: 9.3  . Smokeless tobacco: Never Used  Substance and Sexual Activity  . Alcohol use: Not on file  . Drug use: No  . Sexual activity: Not on file  Lifestyle  . Physical activity:    Days per week: Not on file    Minutes per session: Not on file  . Stress: Not on file  Relationships  . Social connections:    Talks on phone: Not on file    Gets together: Not on file    Attends religious service: Not on file    Active member of club or organization: Not on file    Attends meetings of clubs or organizations: Not on file    Relationship status: Not on file  . Intimate partner violence:    Fear of current or ex partner: Not on file    Emotionally abused: Not on file    Physically abused: Not on file    Forced sexual activity: Not on file  Other Topics Concern  . Not on file  Social History Narrative  . Not on file    Family History  Problem Relation Age of Onset  . Cancer Father   . Cancer Sister      Current Outpatient Medications:  .  aspirin EC 81 MG tablet, Take 81 mg by mouth daily., Disp: , Rfl:  .  lisinopril-hydrochlorothiazide (PRINZIDE,ZESTORETIC) 10-12.5 MG tablet, Take 1 tablet by mouth daily., Disp: , Rfl: 3  Physical exam:  Vitals:   01/09/18 0833  BP: 120/86  Pulse: 74  Resp: 18  Temp: 97.8 F (36.6 C)  TempSrc: Tympanic  SpO2: 97%  Weight: 250 lb 8.8 oz (113.6 kg)  Height: 6' (1.829 m)   Physical Exam  Constitutional: He is oriented to person, place, and time. He appears well-developed and well-nourished.  HENT:  Head: Normocephalic and atraumatic.  Eyes: Pupils are equal, round, and reactive to light. EOM are normal.  Neck: Normal range of motion.  Cardiovascular: Normal rate, regular rhythm and normal heart sounds.  Pulmonary/Chest: Effort normal and breath sounds normal.  Abdominal: Soft.  Bowel sounds are normal.  Neurological: He is alert and oriented to person, place, and time.  Skin: Skin is warm and dry.     No flowsheet data found. CBC Latest Ref Rng & Units 01/09/2018  WBC 3.8 - 10.6 K/uL 6.8  Hemoglobin 13.0 - 18.0 g/dL 17.3  Hematocrit 40.0 - 52.0 % 50.6  Platelets 150 - 440 K/uL 257      Assessment and plan- Patient is a 52 y.o. male with testosterone induced secondary polycythemia  Hematocrit today is borderline high at 50.6. He remains off testosterone. He has not seen endocrinology yet. I will hold off on phlebotomy today. Repeat cbc in 2 and 4 moths. If there is a persistent increase in his hematocrit, I will consider doing bone marrow biopsy to rule out myeloproliferative disorder despite negative JAK2. I would like Dr. Ellison Hughs to consider evaluating him for OSA with a formal sleep study at this time. He has  not smoked for last several years.   Headaches- being worked up by [employer]. Consider MRI brain and neurology referal if symptoms persist. Patient will be talking to his pcp about this   Visit Diagnosis 1. Polycythemia, secondary      Dr. Randa Evens, MD, MPH [facility] [phone] 01/09/2018 9:06 AM

## 2018-01-09 NOTE — Telephone Encounter (Signed)
Called Dr.  Quita Skye office [phone] and asked if pt could be evaluated for OSA.  I spoke to [place] and she made pt appt. For 5/10 at 11:20.  I called pt and left him a message that he has an appt with PCP and if he can't make it to please call PCP office

## 2018-03-06 ENCOUNTER — Telehealth: Payer: Self-pay

## 2018-03-06 ENCOUNTER — Inpatient Hospital Stay: Payer: Self-pay

## 2018-03-06 ENCOUNTER — Inpatient Hospital Stay: Payer: Self-pay | Attending: Oncology

## 2018-03-06 DIAGNOSIS — D751 Secondary polycythemia: Secondary | ICD-10-CM

## 2018-03-06 LAB — CBC
HCT: 47.2 % (ref 40.0–52.0)
Hemoglobin: 16.5 g/dL (ref 13.0–18.0)
MCH: 29.3 pg (ref 26.0–34.0)
MCHC: 34.9 g/dL (ref 32.0–36.0)
MCV: 83.9 fL (ref 80.0–100.0)
PLATELETS: 237 10*3/uL (ref 150–440)
RBC: 5.62 MIL/uL (ref 4.40–5.90)
RDW: 13.2 % (ref 11.5–14.5)
WBC: 6 10*3/uL (ref 3.8–10.6)

## 2018-03-06 NOTE — Telephone Encounter (Signed)
Spoke with Jerry Espinoza to inform him that No need for phlebotomy.hold off on bone marrow biopsy at this time. To keep schedule appointment (05/01/18) The patient was agreeable and understanding

## 2018-03-06 NOTE — Telephone Encounter (Signed)
-----  Message from Sindy Guadeloupe, MD sent at 03/06/2018 10:25 AM EDT ----- H/H 16.5/47.2. No need for phlebotomy.hold off on bone marrow biopsy at this time

## 2018-05-01 ENCOUNTER — Inpatient Hospital Stay: Payer: Self-pay

## 2018-05-01 ENCOUNTER — Inpatient Hospital Stay: Payer: Self-pay | Attending: Oncology | Admitting: Oncology

## 2018-05-01 ENCOUNTER — Encounter: Payer: Self-pay | Admitting: Oncology

## 2018-05-01 VITALS — BP 137/86 | HR 76 | Temp 97.6°F | Resp 18 | Ht 72.0 in | Wt 266.8 lb

## 2018-05-01 DIAGNOSIS — I10 Essential (primary) hypertension: Secondary | ICD-10-CM | POA: Insufficient documentation

## 2018-05-01 DIAGNOSIS — R5383 Other fatigue: Secondary | ICD-10-CM | POA: Insufficient documentation

## 2018-05-01 DIAGNOSIS — D751 Secondary polycythemia: Secondary | ICD-10-CM | POA: Insufficient documentation

## 2018-05-01 DIAGNOSIS — Z7982 Long term (current) use of aspirin: Secondary | ICD-10-CM | POA: Insufficient documentation

## 2018-05-01 DIAGNOSIS — L299 Pruritus, unspecified: Secondary | ICD-10-CM | POA: Insufficient documentation

## 2018-05-01 DIAGNOSIS — E291 Testicular hypofunction: Secondary | ICD-10-CM | POA: Insufficient documentation

## 2018-05-01 DIAGNOSIS — E785 Hyperlipidemia, unspecified: Secondary | ICD-10-CM | POA: Insufficient documentation

## 2018-05-01 DIAGNOSIS — Z79899 Other long term (current) drug therapy: Secondary | ICD-10-CM | POA: Insufficient documentation

## 2018-05-01 DIAGNOSIS — R351 Nocturia: Secondary | ICD-10-CM | POA: Insufficient documentation

## 2018-05-01 DIAGNOSIS — Z87891 Personal history of nicotine dependence: Secondary | ICD-10-CM | POA: Insufficient documentation

## 2018-05-01 DIAGNOSIS — Z8041 Family history of malignant neoplasm of ovary: Secondary | ICD-10-CM | POA: Insufficient documentation

## 2018-05-01 LAB — CBC
HEMATOCRIT: 48.4 % (ref 40.0–52.0)
HEMOGLOBIN: 16.4 g/dL (ref 13.0–18.0)
MCH: 29 pg (ref 26.0–34.0)
MCHC: 33.9 g/dL (ref 32.0–36.0)
MCV: 85.3 fL (ref 80.0–100.0)
Platelets: 243 10*3/uL (ref 150–440)
RBC: 5.67 MIL/uL (ref 4.40–5.90)
RDW: 13.4 % (ref 11.5–14.5)
WBC: 6.2 10*3/uL (ref 3.8–10.6)

## 2018-05-01 NOTE — Progress Notes (Signed)
No new changes noted today 

## 2018-05-01 NOTE — Progress Notes (Signed)
Hematology/Oncology Consult note [facility]  Telephone:([phone] Fax:([phone]  Patient Care Team: Sofie Hartigan, MD as PCP - General (Family Medicine)   Name of the patient: Jerry Espinoza  [id number]  1966-06-21   Date of visit: 05/01/18  Diagnosis-polycythemia secondary to testosterone use.  Patient is currently off testosterone  Chief complaint/ Reason for visit-routine follow-up of polycythemia  Heme/Onc history: patient is a 52 year old male who has been referred to [place] for evaluation and management of polycythemia. CBC done on 06/23/2017 showed white count of 7.4, H&H of 18.5/52.8 and platelet count 257. One month ago his H&H was 19.6/56.3. His past medical history is significant for hypertension and hyperlipidemia and hypogonadism. He is on testosterone on replacement therapy through a clinic in [place]. His hemoglobin has been between 17-18 units back in 2015. Serum free testosterone level on 06/07/2017 was low at0.89 and total testosterone was low at 37.  Patient states he has been off testosterone replacement for 3 months now. Reports itching mainly at the back of his scalp and over his trunk. Denies any exposure to new soaps or chemicals. He has also been started on BP meds for his HTN. He smoked for 29 years but quit smoking 10 years ago. He wakes up multiple times at night due to frequent urination and snores at night but often gets a restful sleep. He reports about 7-8 pounds of intentional weight loss over last few months  Results of blood work from 06/27/2017 were as follows: CBC showed a normal white count and the platelet count and H&H was elevated at 19.4/56.3 respectively.EPOlevels were normal at 5.6. Jak 2 mutation testing was negative. Urinalysis did not reveal any evidence of hematuria. Chest x-ray showed no active cardiopulmonary disease  Given patient's elevated despite stopping testosterone, phlebotomy was  initiated on 06/27/2017  Interval history-patient continues to remain off testosterone and does not report feeling overtly fatigued.  He has not required phlebotomy for over 4 months now.  During his last visit he was complaining of frequent headaches and was prescribed nasal spray which has been helping him.  ECOG PS- 0 Pain scale- 0 Opioid associated constipation- no  Review of systems- Review of Systems  Constitutional: Positive for malaise/fatigue. Negative for chills, fever and weight loss.  HENT: Negative for congestion, ear discharge and nosebleeds.   Eyes: Negative for blurred vision.  Respiratory: Negative for cough, hemoptysis, sputum production, shortness of breath and wheezing.   Cardiovascular: Negative for chest pain, palpitations, orthopnea and claudication.  Gastrointestinal: Negative for abdominal pain, blood in stool, constipation, diarrhea, heartburn, melena, nausea and vomiting.  Genitourinary: Negative for dysuria, flank pain, frequency, hematuria and urgency.  Musculoskeletal: Negative for back pain, joint pain and myalgias.  Skin: Negative for rash.  Neurological: Negative for dizziness, tingling, focal weakness, seizures, weakness and headaches.  Endo/Heme/Allergies: Does not bruise/bleed easily.  Psychiatric/Behavioral: Negative for depression and suicidal ideas. The patient does not have insomnia.       No Known Allergies   Past Medical History:  Diagnosis Date  . Cancer ([place])   . Ovarian cancer [facility])      History reviewed. No pertinent surgical history.  Social History   Socioeconomic History  . Marital status: Married    Spouse name: Not on file  . Number of children: Not on file  . Years of education: Not on file  . Highest education level: Not on file  Occupational History  . Not on file  Social Needs  .  Financial resource strain: Not on file  . Food insecurity:    Worry: Not on file    Inability: Not on file  . Transportation needs:     Medical: Not on file    Non-medical: Not on file  Tobacco Use  . Smoking status: Former Smoker    Packs/day: 1.50    Years: 29.00    Pack years: 43.50    Last attempt to quit: 09/06/2008    Years since quitting: 9.6  . Smokeless tobacco: Never Used  Substance and Sexual Activity  . Alcohol use: Not on file  . Drug use: No  . Sexual activity: Not on file  Lifestyle  . Physical activity:    Days per week: Not on file    Minutes per session: Not on file  . Stress: Not on file  Relationships  . Social connections:    Talks on phone: Not on file    Gets together: Not on file    Attends religious service: Not on file    Active member of club or organization: Not on file    Attends meetings of clubs or organizations: Not on file    Relationship status: Not on file  . Intimate partner violence:    Fear of current or ex partner: Not on file    Emotionally abused: Not on file    Physically abused: Not on file    Forced sexual activity: Not on file  Other Topics Concern  . Not on file  Social History Narrative  . Not on file    Family History  Problem Relation Age of Onset  . Cancer Father   . Cancer Sister      Current Outpatient Medications:  .  aspirin EC 81 MG tablet, Take 81 mg by mouth daily., Disp: , Rfl:  .  lisinopril-hydrochlorothiazide (PRINZIDE,ZESTORETIC) 10-12.5 MG tablet, Take 1 tablet by mouth daily., Disp: , Rfl: 3  Physical exam:  Vitals:   05/01/18 0837  BP: 137/86  Pulse: 76  Resp: 18  Temp: 97.6 F (36.4 C)  TempSrc: Oral  SpO2: 98%  Weight: 266 lb 12.1 oz (121 kg)  Height: 6' (1.829 m)   Physical Exam  Constitutional: He is oriented to person, place, and time. He appears well-developed and well-nourished.  HENT:  Head: Normocephalic and atraumatic.  Eyes: Pupils are equal, round, and reactive to light. EOM are normal.  Neck: Normal range of motion.  Cardiovascular: Normal rate, regular rhythm and normal heart sounds.  Pulmonary/Chest:  Effort normal and breath sounds normal.  Abdominal: Soft. Bowel sounds are normal.  Neurological: He is alert and oriented to person, place, and time.  Skin: Skin is warm and dry.     No flowsheet data found. CBC Latest Ref Rng & Units 05/01/2018  WBC 3.8 - 10.6 K/uL 6.2  Hemoglobin 13.0 - 18.0 g/dL 16.4  Hematocrit 40.0 - 52.0 % 48.4  Platelets 150 - 440 K/uL 243      Assessment and plan- Patient is a 52 y.o. male with testosterone induced secondary polycythemia  Patient's hematocrit is less than 50 today and he does not require a phlebotomy at this time.  If his polycythemia recurs it would be worthwhile to rule out obstructive sleep apnea as a cause of polycythemia since patient is now off testosterone for a while.  Repeat CBC in 2 months in 4 months and I will see him back in 4 months.  Threshold for phlebotomy at this time is a hematocrit  between 50-55   Visit Diagnosis 1. Polycythemia, secondary      Dr. Randa Evens, MD, MPH [facility] [phone] 05/01/2018 11:59 AM

## 2018-07-03 ENCOUNTER — Inpatient Hospital Stay: Payer: Self-pay

## 2018-07-04 ENCOUNTER — Inpatient Hospital Stay: Payer: Self-pay

## 2018-07-04 ENCOUNTER — Inpatient Hospital Stay: Payer: Self-pay | Attending: Oncology

## 2018-07-04 DIAGNOSIS — D751 Secondary polycythemia: Secondary | ICD-10-CM | POA: Insufficient documentation

## 2018-07-04 LAB — CBC
HCT: 46.7 % (ref 39.0–52.0)
Hemoglobin: 16.1 g/dL (ref 13.0–17.0)
MCH: 29.1 pg (ref 26.0–34.0)
MCHC: 34.5 g/dL (ref 30.0–36.0)
MCV: 84.3 fL (ref 80.0–100.0)
Platelets: 244 10*3/uL (ref 150–400)
RBC: 5.54 MIL/uL (ref 4.22–5.81)
RDW: 12 % (ref 11.5–15.5)
WBC: 5.4 10*3/uL (ref 4.0–10.5)
nRBC: 0 % (ref 0.0–0.2)

## 2018-07-06 ENCOUNTER — Other Ambulatory Visit: Payer: Self-pay

## 2018-09-04 ENCOUNTER — Inpatient Hospital Stay: Payer: Self-pay

## 2018-09-04 ENCOUNTER — Inpatient Hospital Stay: Payer: Self-pay | Attending: Oncology | Admitting: Nurse Practitioner

## 2019-12-06 ENCOUNTER — Other Ambulatory Visit: Payer: Self-pay | Admitting: *Deleted

## 2019-12-06 DIAGNOSIS — D751 Secondary polycythemia: Secondary | ICD-10-CM

## 2019-12-10 ENCOUNTER — Encounter: Payer: Self-pay | Admitting: Oncology

## 2019-12-10 NOTE — Progress Notes (Signed)
Patient is coming in for follow up he is doing well no complaints  

## 2019-12-11 ENCOUNTER — Inpatient Hospital Stay (HOSPITAL_BASED_OUTPATIENT_CLINIC_OR_DEPARTMENT_OTHER): Payer: Self-pay | Admitting: Oncology

## 2019-12-11 ENCOUNTER — Inpatient Hospital Stay: Payer: Self-pay | Attending: Oncology

## 2019-12-11 ENCOUNTER — Other Ambulatory Visit: Payer: Self-pay

## 2019-12-11 ENCOUNTER — Inpatient Hospital Stay: Payer: Self-pay

## 2019-12-11 VITALS — BP 130/95 | HR 105

## 2019-12-11 VITALS — BP 142/101 | HR 96 | Temp 96.5°F | Ht 72.0 in | Wt 287.0 lb

## 2019-12-11 DIAGNOSIS — Z79890 Hormone replacement therapy (postmenopausal): Secondary | ICD-10-CM

## 2019-12-11 DIAGNOSIS — Z79899 Other long term (current) drug therapy: Secondary | ICD-10-CM | POA: Insufficient documentation

## 2019-12-11 DIAGNOSIS — D751 Secondary polycythemia: Secondary | ICD-10-CM

## 2019-12-11 DIAGNOSIS — R531 Weakness: Secondary | ICD-10-CM | POA: Insufficient documentation

## 2019-12-11 DIAGNOSIS — R5383 Other fatigue: Secondary | ICD-10-CM | POA: Insufficient documentation

## 2019-12-11 DIAGNOSIS — Z87891 Personal history of nicotine dependence: Secondary | ICD-10-CM | POA: Insufficient documentation

## 2019-12-11 LAB — CBC
HCT: 55.4 % — ABNORMAL HIGH (ref 39.0–52.0)
Hemoglobin: 19.1 g/dL — ABNORMAL HIGH (ref 13.0–17.0)
MCH: 28.8 pg (ref 26.0–34.0)
MCHC: 34.5 g/dL (ref 30.0–36.0)
MCV: 83.6 fL (ref 80.0–100.0)
Platelets: 265 10*3/uL (ref 150–400)
RBC: 6.63 MIL/uL — ABNORMAL HIGH (ref 4.22–5.81)
RDW: 13.1 % (ref 11.5–15.5)
WBC: 8 10*3/uL (ref 4.0–10.5)
nRBC: 0 % (ref 0.0–0.2)

## 2019-12-12 ENCOUNTER — Telehealth: Payer: Self-pay

## 2019-12-12 LAB — TESTOSTERONE: Testosterone: 980 ng/dL — ABNORMAL HIGH (ref 264–916)

## 2019-12-12 NOTE — Telephone Encounter (Signed)
Called patient but had to leave him a detailed message letting him know that his testosterone level was elevated. I called patient's PCP-Dr. Marya Landry to let them know that patient's testosterone level was elevated and to make adjustments and to call me back in case they had any further questions.

## 2019-12-12 NOTE — Telephone Encounter (Signed)
-----   Message from Sindy Guadeloupe, MD sent at 12/12/2019 10:40 AM EDT ----- Please let patient and the provider who is prescribing him testosterone know- his dose needs to be decreased. Patient was not able to recall the providers name

## 2019-12-12 NOTE — Telephone Encounter (Signed)
Spoke with patient about his testosterone results and relayed the previous message from [place]. Patient verbalized understanding and no further questions

## 2019-12-13 NOTE — Progress Notes (Signed)
Hematology/Oncology Consult note [facility]  Telephone:([phone] [phone]  Patient Care Team: Sofie Hartigan, MD as PCP - General (Family Medicine)   Name of the patient: Jerry Espinoza  [id number]  01-27-1966   Date of visit: 12/13/19  Diagnosis-secondary polycythemia from testosterone replacement therapy  Chief complaint/ Reason for visit-routine follow-up of polycythemia lost to follow-up  Heme/Onc history: patient is a 54 year old male who has been referred to [place] for evaluation and management of polycythemia. CBC done on 06/23/2017 showed white count of 7.4, H&H of 18.5/52.8 and platelet count 257. One month ago his H&H was 19.6/56.3. His past medical history is significant for hypertension and hyperlipidemia and hypogonadism. He is on testosterone on replacement therapy through a clinic in [place]. His hemoglobin has been between 17-18 units back in 2015. Serum free testosterone level on 06/07/2017 was low at0.89 and total testosterone was low at 37.  Results of blood work from 06/27/2017 were as follows: CBC showed a normal white count and the platelet count and H&H was elevated at 19.4/56.3 respectively.EPOlevels were normal at 5.6. Jak 2 mutation testing was negative. Urinalysis did not reveal any evidence of hematuria. Chest x-ray showed no active cardiopulmonary disease  Interval history-patient was last seen by me in August 2019 and was subsequently lost to follow-up.  He has come to reestablish follow-up.  He is back on testosterone replacements which he gets through some external clinic.  He cannot recollect who his provider is.  Currently reports significant fatigue but denies other complaints  ECOG PS- 1 Pain scale- 0   Review of systems- Review of Systems  Constitutional: Positive for malaise/fatigue. Negative for chills, fever and weight loss.  HENT: Negative for congestion, ear discharge and nosebleeds.   Eyes:  Negative for blurred vision.  Respiratory: Negative for cough, hemoptysis, sputum production, shortness of breath and wheezing.   Cardiovascular: Negative for chest pain, palpitations, orthopnea and claudication.  Gastrointestinal: Negative for abdominal pain, blood in stool, constipation, diarrhea, heartburn, melena, nausea and vomiting.  Genitourinary: Negative for dysuria, flank pain, frequency, hematuria and urgency.  Musculoskeletal: Negative for back pain, joint pain and myalgias.  Skin: Negative for rash.  Neurological: Negative for dizziness, tingling, focal weakness, seizures, weakness and headaches.  Endo/Heme/Allergies: Does not bruise/bleed easily.  Psychiatric/Behavioral: Negative for depression and suicidal ideas. The patient does not have insomnia.       No Known Allergies   Past Medical History:  Diagnosis Date  . Cancer ([place])   . Ovarian cancer [facility])      History reviewed. No pertinent surgical history.  Social History   Socioeconomic History  . Marital status: Married    Spouse name: Not on file  . Number of children: Not on file  . Years of education: Not on file  . Highest education level: Not on file  Occupational History  . Not on file  Tobacco Use  . Smoking status: Former Smoker    Packs/day: 1.50    Years: 29.00    Pack years: 43.50    Quit date: 09/06/2008    Years since quitting: 11.2  . Smokeless tobacco: Never Used  Substance and Sexual Activity  . Alcohol use: Not on file  . Drug use: No  . Sexual activity: Not on file  Other Topics Concern  . Not on file  Social History Narrative  . Not on file   Social Determinants of Health   Financial Resource Strain:   . Difficulty of  Paying Living Expenses:   Food Insecurity:   . Worried About Charity fundraiser in the Last Year:   . Arboriculturist in the Last Year:   Transportation Needs:   . Film/video editor (Medical):   Marland Kitchen Lack of Transportation (Non-Medical):   Physical  Activity:   . Days of Exercise per Week:   . Minutes of Exercise per Session:   Stress:   . Feeling of Stress :   Social Connections:   . Frequency of Communication with Friends and Family:   . Frequency of Social Gatherings with Friends and Family:   . Attends Religious Services:   . Active Member of Clubs or Organizations:   . Attends Archivist Meetings:   Marland Kitchen Marital Status:   Intimate Partner Violence:   . Fear of Current or Ex-Partner:   . Emotionally Abused:   Marland Kitchen Physically Abused:   . Sexually Abused:     Family History  Problem Relation Age of Onset  . Cancer Father   . Cancer Sister      Current Outpatient Medications:  .  hydrochlorothiazide (HYDRODIURIL) 25 MG tablet, Take 25 mg by mouth daily., Disp: , Rfl:  .  losartan (COZAAR) 50 MG tablet, TAKE 1 TABLET BY MOUTH EVERY DAY, Disp: , Rfl:   Physical exam:  Vitals:   12/11/19 1121  BP: (!) 142/101  Pulse: 96  Temp: (!) 96.5 F (35.8 C)  TempSrc: Tympanic  SpO2: 97%  Weight: 287 lb (130.2 kg)  Height: 6' (1.829 m)   Physical Exam Constitutional:      General: He is not in acute distress. HENT:     Head: Normocephalic and atraumatic.  Eyes:     Pupils: Pupils are equal, round, and reactive to light.  Cardiovascular:     Rate and Rhythm: Normal rate and regular rhythm.     Heart sounds: Normal heart sounds.  Pulmonary:     Effort: Pulmonary effort is normal.     Breath sounds: Normal breath sounds.  Abdominal:     General: Bowel sounds are normal.     Palpations: Abdomen is soft.  Musculoskeletal:     Cervical back: Normal range of motion.  Skin:    General: Skin is warm and dry.  Neurological:     Mental Status: He is alert and oriented to person, place, and time.      No flowsheet data found. CBC Latest Ref Rng & Units 12/11/2019  WBC 4.0 - 10.5 K/uL 8.0  Hemoglobin 13.0 - 17.0 g/dL 19.1(H)  Hematocrit 39.0 - 52.0 % 55.4(H)  Platelets 150 - 400 K/uL 265     Assessment and  plan- Patient is a 54 y.o. male with secondary polycythemia from testosterone replacement therapy  Patient's H&H is significantly elevated at 19.1/55.4.  He will therefore proceed with phlebotomy at this time and he will continue to get weekly phlebotomies 500 cc until hematocrit is less than 50.  I have highly encouraged the patient to speak to the provider who is giving him testosterone to make sure that he is at the lowest possible dose of testosterone that would alleviate his symptoms.  If he continues to remain on high doses of testosterone he will likely need frequent phlebotomies.  Testosterone replacement therapy also increases the risk of thromboembolic events and prostate cancer.  I will check his PSA level at his next labs in 1 week.  I have also asked the patient to give me information  regarding the provider was prescribing him testosterone replacements since it is not entirely clear if this is coming through his primary care doctor.  He should ideally follow-up with urology or endocrinology for this  Patient's testosterone levels are significantly elevated at 980.  I therefore feel that there is definite room to go down on the dose of his testosterone.  Patient verbalized understanding.  Patient will continue to get weekly phlebotomies until hematocrit is less than 50.  I will tentatively see him back in 3 months time with CBC with differential for possible phlebotomy   Visit Diagnosis 1. Polycythemia, secondary   2. Long-term current use of testosterone replacement therapy      Dr. Randa Evens, MD, MPH [facility] [id number] 12/13/2019 11:51 AM

## 2019-12-19 ENCOUNTER — Inpatient Hospital Stay: Payer: Self-pay

## 2019-12-19 ENCOUNTER — Other Ambulatory Visit: Payer: Self-pay

## 2019-12-19 VITALS — BP 125/87 | HR 92 | Temp 96.1°F | Wt 291.6 lb

## 2019-12-19 DIAGNOSIS — D751 Secondary polycythemia: Secondary | ICD-10-CM

## 2019-12-19 LAB — HEMOGLOBIN AND HEMATOCRIT, BLOOD
HCT: 50.4 % (ref 39.0–52.0)
Hemoglobin: 17.4 g/dL — ABNORMAL HIGH (ref 13.0–17.0)

## 2019-12-19 LAB — PSA: Prostatic Specific Antigen: 1.12 ng/mL (ref 0.00–4.00)

## 2019-12-26 ENCOUNTER — Telehealth: Payer: Self-pay | Admitting: *Deleted

## 2019-12-26 ENCOUNTER — Inpatient Hospital Stay: Payer: Self-pay

## 2019-12-26 NOTE — Telephone Encounter (Signed)
Got a secure chat from [place] that pt can't come in today because can't get off of work. He had phlebotomy last week. And he was callin to see what psa was 1.12 and that is normal. When I called and spoke to him he does not think he will need to get phlebotomy next time since he already had a phlebtomy but he will come to his next scheduled appt and get labs to make decision, he was mostly calling to get psa level

## 2020-01-02 ENCOUNTER — Inpatient Hospital Stay: Payer: Self-pay

## 2020-01-09 ENCOUNTER — Inpatient Hospital Stay: Payer: Self-pay

## 2020-01-09 ENCOUNTER — Inpatient Hospital Stay: Payer: Self-pay | Attending: Oncology

## 2020-01-16 ENCOUNTER — Inpatient Hospital Stay: Payer: Self-pay

## 2020-01-23 ENCOUNTER — Inpatient Hospital Stay: Payer: Self-pay

## 2020-01-30 ENCOUNTER — Inpatient Hospital Stay: Payer: Self-pay

## 2020-02-06 ENCOUNTER — Inpatient Hospital Stay: Payer: Self-pay | Attending: Oncology

## 2020-02-06 ENCOUNTER — Inpatient Hospital Stay: Payer: Self-pay

## 2020-02-13 ENCOUNTER — Inpatient Hospital Stay: Payer: Self-pay

## 2020-02-20 ENCOUNTER — Inpatient Hospital Stay: Payer: Self-pay

## 2020-02-21 ENCOUNTER — Encounter: Payer: Self-pay | Admitting: Oncology

## 2020-02-21 ENCOUNTER — Telehealth: Payer: Self-pay | Admitting: Oncology

## 2020-02-21 NOTE — Telephone Encounter (Signed)
Per review of patient's chart, patient has missed weekly lab and phlebotomy appts since 01-02-20. Per [place] MD request, weekly appts have been cancelled. Next appt with [facility] remains scheduled for 03-18-20. Letter sent to patient informing that patient's next appt is scheduled for 03-18-20 and to phone the [facility] with any questions.

## 2020-02-27 ENCOUNTER — Inpatient Hospital Stay: Payer: Self-pay

## 2020-03-05 ENCOUNTER — Inpatient Hospital Stay: Payer: Self-pay

## 2020-03-12 ENCOUNTER — Inpatient Hospital Stay: Payer: Self-pay

## 2020-03-18 ENCOUNTER — Other Ambulatory Visit: Payer: Self-pay

## 2020-03-18 ENCOUNTER — Telehealth: Payer: Self-pay | Admitting: Oncology

## 2020-03-18 ENCOUNTER — Encounter: Payer: Self-pay | Admitting: Oncology

## 2020-03-18 ENCOUNTER — Inpatient Hospital Stay: Payer: Self-pay | Admitting: Oncology

## 2020-03-18 NOTE — Telephone Encounter (Signed)
Patient did not attend appt on this date. Writer phoned patient and left voicemail requesting return call to reschedule. Writer also mailed letter to patient requesting patient Engineer, maintenance to reschedule.

## 2020-04-17 ENCOUNTER — Inpatient Hospital Stay: Payer: Self-pay

## 2020-04-17 ENCOUNTER — Inpatient Hospital Stay: Payer: Self-pay | Attending: Oncology

## 2020-04-17 ENCOUNTER — Inpatient Hospital Stay (HOSPITAL_BASED_OUTPATIENT_CLINIC_OR_DEPARTMENT_OTHER): Payer: Self-pay | Admitting: Oncology

## 2020-04-17 ENCOUNTER — Other Ambulatory Visit: Payer: Self-pay

## 2020-04-17 ENCOUNTER — Encounter: Payer: Self-pay | Admitting: Oncology

## 2020-04-17 ENCOUNTER — Encounter (INDEPENDENT_AMBULATORY_CARE_PROVIDER_SITE_OTHER): Payer: Self-pay

## 2020-04-17 VITALS — BP 152/96 | HR 83 | Temp 98.6°F | Resp 16 | Ht 72.0 in | Wt 293.9 lb

## 2020-04-17 VITALS — BP 141/91 | HR 88

## 2020-04-17 DIAGNOSIS — D751 Secondary polycythemia: Secondary | ICD-10-CM

## 2020-04-17 DIAGNOSIS — R531 Weakness: Secondary | ICD-10-CM | POA: Insufficient documentation

## 2020-04-17 DIAGNOSIS — R5383 Other fatigue: Secondary | ICD-10-CM | POA: Insufficient documentation

## 2020-04-17 DIAGNOSIS — Z87891 Personal history of nicotine dependence: Secondary | ICD-10-CM | POA: Insufficient documentation

## 2020-04-17 LAB — CBC
HCT: 56.2 % — ABNORMAL HIGH (ref 39.0–52.0)
Hemoglobin: 18.7 g/dL — ABNORMAL HIGH (ref 13.0–17.0)
MCH: 26.9 pg (ref 26.0–34.0)
MCHC: 33.3 g/dL (ref 30.0–36.0)
MCV: 80.7 fL (ref 80.0–100.0)
Platelets: 213 10*3/uL (ref 150–400)
RBC: 6.96 MIL/uL — ABNORMAL HIGH (ref 4.22–5.81)
RDW: 14.5 % (ref 11.5–15.5)
WBC: 6.1 10*3/uL (ref 4.0–10.5)
nRBC: 0 % (ref 0.0–0.2)

## 2020-04-17 NOTE — Progress Notes (Signed)
Pt eats and drinks good, has goo BM. He gets tired when he has high hct. He has reduced his dose of testosterone to 1/2 ml- not sure what that equates  far mg. Pt feels like he will decrease the testosterone to help get hct lower.

## 2020-04-17 NOTE — Progress Notes (Signed)
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 CBC done on 06/23/2017 showed white count of 7.4, H&H of 18.5/52.8 and platelet count 257. One month ago his H&H was 19.6/56.3. His past medical history is significant for hypertension and hyperlipidemia and hypogonadism. He is on testosterone on replacement therapy through a clinic in [place]. His hemoglobin has been between 17-18 units back in 2015. Serum free testosterone level on 06/07/2017 was low at0.89 and total testosterone was low at 37.  Results of blood work from 06/27/2017 were as follows: CBC showed a normal white count and the platelet count and H&H was elevated at 19.4/56.3 respectively.EPOlevels were normal at 5.6. Jak 2 mutation testing was negative. Urinalysis did not reveal any evidence of hematuria. Chest x-ray showed no active cardiopulmonary disease   Interval history-patient continues to remain on testosterone but was recently found to have elevated hemoglobin and hematocrit and is here to reestablish follow-up for phlebotomy.  Reports ongoing fatigue.  He has decided to cut back on the dose of testosterone to half and maybe eventually consider coming off it altogether. ECOG PS- 1 Pain scale- 0  Review of systems- Review of Systems  Constitutional: Positive for malaise/fatigue. Negative for chills, fever and weight loss.  HENT: Negative for congestion, ear discharge and nosebleeds.   Eyes: Negative for  blurred vision.  Respiratory: Negative for cough, hemoptysis, sputum production, shortness of breath and wheezing.   Cardiovascular: Negative for chest pain, palpitations, orthopnea and claudication.  Gastrointestinal: Negative for abdominal pain, blood in stool, constipation, diarrhea, heartburn, melena, nausea and vomiting.  Genitourinary: Negative for dysuria, flank pain, frequency, hematuria and urgency.  Musculoskeletal: Negative for back pain, joint pain and myalgias.  Skin: Negative for rash.  Neurological: Negative for dizziness, tingling, focal weakness, seizures, weakness and headaches.  Endo/Heme/Allergies: Does not bruise/bleed easily.  Psychiatric/Behavioral: Negative for depression and suicidal ideas. The patient does not have insomnia.       No Known Allergies   Past Medical History:  Diagnosis Date  . Cancer ([place])   . Ovarian cancer ([place])   . Polycythemia, secondary      History reviewed. No pertinent surgical history.  Social History   Socioeconomic History  . Marital status: Married    Spouse name: Not on file  . Number of children: Not on file  . Years of education: Not on file  . Highest education level: Not on file  Occupational History  . Not on file  Tobacco Use  . Smoking status: Former Smoker    Packs/day: 1.50    Years: 29.00    Pack years: 43.50    Quit date: 09/06/2008    Years since quitting: 11.6  . Smokeless tobacco: Never Used  Vaping Use  . Vaping Use: Never used  Substance and Sexual Activity  . Alcohol use: Not on file  . Drug use: No  . Sexual activity: Not on file  Other Topics Concern  . Not on file  Social History Narrative  . Not on file   Social Determinants of Health  Financial Resource Strain:   . Difficulty of Paying Living Expenses:   Food Insecurity:   . Worried About Charity fundraiser in the Last Year:   . Arboriculturist in the Last Year:   Transportation Needs:   . Film/video editor (Medical):   Marland Kitchen  Lack of Transportation (Non-Medical):   Physical Activity:   . Days of Exercise per Week:   . Minutes of Exercise per Session:   Stress:   . Feeling of Stress :   Social Connections:   . Frequency of Communication with Friends and Family:   . Frequency of Social Gatherings with Friends and Family:   . Attends Religious Services:   . Active Member of Clubs or Organizations:   . Attends Archivist Meetings:   Marland Kitchen Marital Status:   Intimate Partner Violence:   . Fear of Current or Ex-Partner:   . Emotionally Abused:   Marland Kitchen Physically Abused:   . Sexually Abused:     Family History  Problem Relation Age of Onset  . Cancer Father   . Cancer Sister      Current Outpatient Medications:  .  hydrochlorothiazide (HYDRODIURIL) 25 MG tablet, Take 25 mg by mouth daily., Disp: , Rfl:  .  losartan (COZAAR) 50 MG tablet, TAKE 1 TABLET BY MOUTH EVERY DAY, Disp: , Rfl:  .  TESTOSTERONE CYPIONATE IJ, Inject 1 Dose as directed 2 (two) times a week., Disp: , Rfl:   Physical exam:  Vitals:   04/17/20 1128  BP: (!) 152/96  Pulse: 83  Resp: 16  Temp: 98.6 F (37 C)  TempSrc: Oral  Weight: 293 lb 14.4 oz (133.3 kg)  Height: 6' (1.829 m)   Physical Exam Constitutional:      General: He is not in acute distress.    Comments: Face appears plethoric  Pulmonary:     Effort: Pulmonary effort is normal.  Skin:    General: Skin is warm and dry.  Neurological:     Mental Status: He is alert and oriented to person, place, and time.      No flowsheet data found. CBC Latest Ref Rng & Units 04/17/2020  WBC 4.0 - 10.5 K/uL 6.1  Hemoglobin 13.0 - 17.0 g/dL 18.7(H)  Hematocrit 39 - 52 % 56.2(H)  Platelets 150 - 400 K/uL 213     Assessment and plan- Patient is a 54 y.o. 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  He will therefore proceed with weekly phlebotomy at this time with a  goal hematocrit of less than 50.  Patient understands that as long as he is on testosterone he is likely going to have secondary polycythemia for which she will continue to receive phlebotomy.  Patient is considering coming off testosterone replacement therapy.  After his hematocrit come down to less than 50 we will check his H&H every 2 months and I will see him back in 4 months for possible phlebotomy.  I will check a testosterone level as well at his next lab   Visit Diagnosis 1. Polycythemia, secondary      Dr. Randa Evens, MD, MPH [facility] at [facility] [phone] 04/17/2020 4:14 PM

## 2020-04-23 ENCOUNTER — Inpatient Hospital Stay: Payer: Self-pay

## 2020-04-23 ENCOUNTER — Other Ambulatory Visit: Payer: Self-pay

## 2020-04-23 VITALS — BP 158/97 | HR 72 | Temp 99.1°F | Resp 16

## 2020-04-23 DIAGNOSIS — D751 Secondary polycythemia: Secondary | ICD-10-CM

## 2020-04-23 LAB — CBC WITH DIFFERENTIAL/PLATELET
Abs Immature Granulocytes: 0.06 10*3/uL (ref 0.00–0.07)
Basophils Absolute: 0.1 10*3/uL (ref 0.0–0.1)
Basophils Relative: 1 %
Eosinophils Absolute: 0.4 10*3/uL (ref 0.0–0.5)
Eosinophils Relative: 7 %
HCT: 50.6 % (ref 39.0–52.0)
Hemoglobin: 17.1 g/dL — ABNORMAL HIGH (ref 13.0–17.0)
Immature Granulocytes: 1 %
Lymphocytes Relative: 27 %
Lymphs Abs: 1.7 10*3/uL (ref 0.7–4.0)
MCH: 27.1 pg (ref 26.0–34.0)
MCHC: 33.8 g/dL (ref 30.0–36.0)
MCV: 80.3 fL (ref 80.0–100.0)
Monocytes Absolute: 0.8 10*3/uL (ref 0.1–1.0)
Monocytes Relative: 12 %
Neutro Abs: 3.5 10*3/uL (ref 1.7–7.7)
Neutrophils Relative %: 52 %
Platelets: 218 10*3/uL (ref 150–400)
RBC: 6.3 MIL/uL — ABNORMAL HIGH (ref 4.22–5.81)
RDW: 13.7 % (ref 11.5–15.5)
WBC: 6.6 10*3/uL (ref 4.0–10.5)
nRBC: 0 % (ref 0.0–0.2)

## 2020-04-24 ENCOUNTER — Inpatient Hospital Stay: Payer: Self-pay

## 2020-04-24 LAB — TESTOSTERONE: Testosterone: 389 ng/dL (ref 264–916)

## 2020-04-25 ENCOUNTER — Inpatient Hospital Stay: Payer: Self-pay

## 2020-05-01 ENCOUNTER — Inpatient Hospital Stay: Payer: Self-pay

## 2020-05-01 ENCOUNTER — Other Ambulatory Visit: Payer: Self-pay

## 2020-05-01 VITALS — BP 154/90 | HR 88

## 2020-05-01 DIAGNOSIS — D751 Secondary polycythemia: Secondary | ICD-10-CM

## 2020-05-01 LAB — HEMOGLOBIN AND HEMATOCRIT, BLOOD
HCT: 52.1 % — ABNORMAL HIGH (ref 39.0–52.0)
Hemoglobin: 17.5 g/dL — ABNORMAL HIGH (ref 13.0–17.0)

## 2020-05-08 ENCOUNTER — Inpatient Hospital Stay: Payer: Self-pay

## 2020-05-08 ENCOUNTER — Inpatient Hospital Stay: Payer: Self-pay | Attending: Oncology

## 2020-05-08 ENCOUNTER — Other Ambulatory Visit: Payer: Self-pay

## 2020-05-08 DIAGNOSIS — D751 Secondary polycythemia: Secondary | ICD-10-CM | POA: Insufficient documentation

## 2020-05-08 LAB — HEMOGLOBIN AND HEMATOCRIT, BLOOD
HCT: 47.5 % (ref 39.0–52.0)
Hemoglobin: 16.4 g/dL (ref 13.0–17.0)

## 2020-05-08 NOTE — Progress Notes (Signed)
No phlebotomy needed today since his HCT is 47.5. Patient informed and was sent home. Patient had no complaints today.

## 2020-05-15 ENCOUNTER — Inpatient Hospital Stay: Payer: Self-pay

## 2020-05-22 ENCOUNTER — Inpatient Hospital Stay: Payer: Self-pay

## 2020-05-29 ENCOUNTER — Inpatient Hospital Stay: Payer: Self-pay

## 2020-06-05 ENCOUNTER — Other Ambulatory Visit: Payer: Self-pay

## 2020-06-05 ENCOUNTER — Inpatient Hospital Stay: Payer: Self-pay

## 2020-06-05 DIAGNOSIS — D751 Secondary polycythemia: Secondary | ICD-10-CM

## 2020-06-05 LAB — CBC WITH DIFFERENTIAL/PLATELET
Abs Immature Granulocytes: 0.11 10*3/uL — ABNORMAL HIGH (ref 0.00–0.07)
Basophils Absolute: 0.1 10*3/uL (ref 0.0–0.1)
Basophils Relative: 1 %
Eosinophils Absolute: 0.5 10*3/uL (ref 0.0–0.5)
Eosinophils Relative: 7 %
HCT: 48.6 % (ref 39.0–52.0)
Hemoglobin: 16.5 g/dL (ref 13.0–17.0)
Immature Granulocytes: 2 %
Lymphocytes Relative: 29 %
Lymphs Abs: 2 10*3/uL (ref 0.7–4.0)
MCH: 26.9 pg (ref 26.0–34.0)
MCHC: 34 g/dL (ref 30.0–36.0)
MCV: 79.3 fL — ABNORMAL LOW (ref 80.0–100.0)
Monocytes Absolute: 0.5 10*3/uL (ref 0.1–1.0)
Monocytes Relative: 8 %
Neutro Abs: 3.6 10*3/uL (ref 1.7–7.7)
Neutrophils Relative %: 53 %
Platelets: 207 10*3/uL (ref 150–400)
RBC: 6.13 MIL/uL — ABNORMAL HIGH (ref 4.22–5.81)
RDW: 13.5 % (ref 11.5–15.5)
WBC: 6.7 10*3/uL (ref 4.0–10.5)
nRBC: 0 % (ref 0.0–0.2)

## 2020-06-05 NOTE — Progress Notes (Unsigned)
No phlebotomy needed today due to HCT at 48.6 per parameters. Pt updated and all questions answered at this time.   [employer]

## 2020-06-12 ENCOUNTER — Inpatient Hospital Stay: Payer: Self-pay | Attending: Oncology

## 2020-06-12 ENCOUNTER — Inpatient Hospital Stay: Payer: Self-pay

## 2020-06-12 DIAGNOSIS — D751 Secondary polycythemia: Secondary | ICD-10-CM | POA: Insufficient documentation

## 2020-06-19 ENCOUNTER — Inpatient Hospital Stay: Payer: Self-pay

## 2020-06-26 ENCOUNTER — Inpatient Hospital Stay: Payer: Self-pay

## 2020-07-03 ENCOUNTER — Inpatient Hospital Stay: Payer: Self-pay

## 2020-07-03 ENCOUNTER — Other Ambulatory Visit: Payer: Self-pay

## 2020-07-03 ENCOUNTER — Telehealth: Payer: Self-pay | Admitting: *Deleted

## 2020-07-03 DIAGNOSIS — D751 Secondary polycythemia: Secondary | ICD-10-CM

## 2020-07-03 LAB — HEMOGLOBIN AND HEMATOCRIT, BLOOD
HCT: 52.8 % — ABNORMAL HIGH (ref 39.0–52.0)
Hemoglobin: 17.6 g/dL — ABNORMAL HIGH (ref 13.0–17.0)

## 2020-07-03 NOTE — Telephone Encounter (Signed)
Patient came in today and he did not realize that his phlebotomy appointment have been canceled due to he has weekly appointments and he is missed 4 5 in a row.  Patient understood about that and we have rescheduled him for phlebotomy tomorrow.  He wants to have his next phlebotomy in 5 weeks because that is about how long he thinks it takes for his hematocrit to get up high enough to need a phlebotomy.  I spoke to Dr. Janese Banks about this and his next appointment this year is 12/14 which is a few days after 5 weeks.  Dr. Janese Banks is okay with this appointment date and the patient is also and I have confirmed that he will be here December 14 at 10:45 for labs then see the doctor and then if he needs a phlebotomy it will be done at the same day

## 2020-07-04 ENCOUNTER — Inpatient Hospital Stay: Payer: Self-pay | Attending: Oncology

## 2020-07-04 VITALS — BP 139/94 | HR 94 | Temp 98.6°F | Resp 20

## 2020-07-04 DIAGNOSIS — D751 Secondary polycythemia: Secondary | ICD-10-CM | POA: Insufficient documentation

## 2020-07-10 ENCOUNTER — Inpatient Hospital Stay: Payer: Self-pay

## 2020-07-17 ENCOUNTER — Inpatient Hospital Stay: Payer: Self-pay

## 2020-07-24 ENCOUNTER — Inpatient Hospital Stay: Payer: Self-pay

## 2020-08-01 ENCOUNTER — Inpatient Hospital Stay: Payer: Self-pay

## 2020-08-07 ENCOUNTER — Inpatient Hospital Stay: Payer: Self-pay

## 2020-08-14 ENCOUNTER — Inpatient Hospital Stay: Payer: Self-pay

## 2020-08-19 ENCOUNTER — Telehealth: Payer: Self-pay | Admitting: Oncology

## 2020-08-19 ENCOUNTER — Inpatient Hospital Stay: Payer: Self-pay | Admitting: Oncology

## 2020-08-19 ENCOUNTER — Inpatient Hospital Stay: Payer: Self-pay

## 2020-08-19 ENCOUNTER — Inpatient Hospital Stay: Payer: Self-pay | Attending: Oncology

## 2020-08-19 DIAGNOSIS — D751 Secondary polycythemia: Secondary | ICD-10-CM | POA: Insufficient documentation

## 2020-08-19 NOTE — Telephone Encounter (Signed)
Left message to return call to reschedule missed appointment

## 2020-08-28 ENCOUNTER — Telehealth: Payer: Self-pay | Admitting: *Deleted

## 2020-08-28 ENCOUNTER — Other Ambulatory Visit: Payer: Self-pay

## 2020-08-28 ENCOUNTER — Inpatient Hospital Stay: Payer: Self-pay

## 2020-08-28 VITALS — BP 145/92 | HR 81 | Temp 96.8°F | Resp 18

## 2020-08-28 DIAGNOSIS — D751 Secondary polycythemia: Secondary | ICD-10-CM

## 2020-08-28 LAB — CBC WITH DIFFERENTIAL/PLATELET
Abs Immature Granulocytes: 0.09 10*3/uL — ABNORMAL HIGH (ref 0.00–0.07)
Basophils Absolute: 0.1 10*3/uL (ref 0.0–0.1)
Basophils Relative: 1 %
Eosinophils Absolute: 0.5 10*3/uL (ref 0.0–0.5)
Eosinophils Relative: 6 %
HCT: 53.7 % — ABNORMAL HIGH (ref 39.0–52.0)
Hemoglobin: 17.6 g/dL — ABNORMAL HIGH (ref 13.0–17.0)
Immature Granulocytes: 1 %
Lymphocytes Relative: 23 %
Lymphs Abs: 1.9 10*3/uL (ref 0.7–4.0)
MCH: 26 pg (ref 26.0–34.0)
MCHC: 32.8 g/dL (ref 30.0–36.0)
MCV: 79.3 fL — ABNORMAL LOW (ref 80.0–100.0)
Monocytes Absolute: 0.8 10*3/uL (ref 0.1–1.0)
Monocytes Relative: 10 %
Neutro Abs: 4.8 10*3/uL (ref 1.7–7.7)
Neutrophils Relative %: 59 %
Platelets: 243 10*3/uL (ref 150–400)
RBC: 6.77 MIL/uL — ABNORMAL HIGH (ref 4.22–5.81)
RDW: 14.1 % (ref 11.5–15.5)
WBC: 8.2 10*3/uL (ref 4.0–10.5)
nRBC: 0 % (ref 0.0–0.2)

## 2020-08-28 NOTE — Telephone Encounter (Signed)
I called pt and let him know that I have made an appt. For him in feb. He wanted me to email him and I did. Patient says he will make sure he comes

## 2020-08-28 NOTE — Progress Notes (Signed)
Pt tolerated 500 ml phlebotomy well. No complaints of any discomfort at time of discharge. Ambulatory.

## 2020-08-28 NOTE — Patient Instructions (Signed)

## 2020-10-26 ENCOUNTER — Other Ambulatory Visit: Payer: Self-pay | Admitting: *Deleted

## 2020-10-26 DIAGNOSIS — D751 Secondary polycythemia: Secondary | ICD-10-CM

## 2020-10-26 DIAGNOSIS — Z79890 Hormone replacement therapy (postmenopausal): Secondary | ICD-10-CM

## 2020-10-28 ENCOUNTER — Inpatient Hospital Stay: Payer: Self-pay | Admitting: Oncology

## 2020-10-28 ENCOUNTER — Inpatient Hospital Stay: Payer: Self-pay

## 2020-11-17 ENCOUNTER — Encounter: Payer: Self-pay | Admitting: Oncology

## 2020-11-17 ENCOUNTER — Inpatient Hospital Stay (HOSPITAL_BASED_OUTPATIENT_CLINIC_OR_DEPARTMENT_OTHER): Payer: Self-pay | Admitting: Oncology

## 2020-11-17 ENCOUNTER — Inpatient Hospital Stay: Payer: Self-pay | Attending: Oncology

## 2020-11-17 ENCOUNTER — Inpatient Hospital Stay: Payer: Self-pay

## 2020-11-17 VITALS — BP 140/95 | HR 85 | Temp 97.1°F | Resp 16

## 2020-11-17 VITALS — BP 141/93 | HR 86 | Temp 96.7°F | Resp 16 | Wt 303.7 lb

## 2020-11-17 DIAGNOSIS — Z79899 Other long term (current) drug therapy: Secondary | ICD-10-CM | POA: Insufficient documentation

## 2020-11-17 DIAGNOSIS — R531 Weakness: Secondary | ICD-10-CM | POA: Insufficient documentation

## 2020-11-17 DIAGNOSIS — D751 Secondary polycythemia: Secondary | ICD-10-CM | POA: Insufficient documentation

## 2020-11-17 DIAGNOSIS — Z87891 Personal history of nicotine dependence: Secondary | ICD-10-CM | POA: Insufficient documentation

## 2020-11-17 DIAGNOSIS — R5383 Other fatigue: Secondary | ICD-10-CM | POA: Insufficient documentation

## 2020-11-17 DIAGNOSIS — Z79890 Hormone replacement therapy (postmenopausal): Secondary | ICD-10-CM

## 2020-11-17 LAB — CBC WITH DIFFERENTIAL/PLATELET
Abs Immature Granulocytes: 0.1 10*3/uL — ABNORMAL HIGH (ref 0.00–0.07)
Basophils Absolute: 0.1 10*3/uL (ref 0.0–0.1)
Basophils Relative: 1 %
Eosinophils Absolute: 0.4 10*3/uL (ref 0.0–0.5)
Eosinophils Relative: 6 %
HCT: 51 % (ref 39.0–52.0)
Hemoglobin: 17.2 g/dL — ABNORMAL HIGH (ref 13.0–17.0)
Immature Granulocytes: 1 %
Lymphocytes Relative: 28 %
Lymphs Abs: 2.1 10*3/uL (ref 0.7–4.0)
MCH: 27.7 pg (ref 26.0–34.0)
MCHC: 33.7 g/dL (ref 30.0–36.0)
MCV: 82.1 fL (ref 80.0–100.0)
Monocytes Absolute: 0.6 10*3/uL (ref 0.1–1.0)
Monocytes Relative: 8 %
Neutro Abs: 4.3 10*3/uL (ref 1.7–7.7)
Neutrophils Relative %: 56 %
Platelets: 203 10*3/uL (ref 150–400)
RBC: 6.21 MIL/uL — ABNORMAL HIGH (ref 4.22–5.81)
RDW: 16.2 % — ABNORMAL HIGH (ref 11.5–15.5)
WBC: 7.5 10*3/uL (ref 4.0–10.5)
nRBC: 0 % (ref 0.0–0.2)

## 2020-11-17 NOTE — Progress Notes (Signed)
Pt doing ok, he feels that he needs a phlebotomy today. He has no pain and he states he has been 1/2 dose of testosterone that he takes 2 times a week.he did says he had covid 1 1/2 months ago

## 2020-11-17 NOTE — Progress Notes (Signed)
Patient tolerated 500 ml Therapeutic phlebotomy well. Pt accepted a snack and a drink. VSS. No complaints at discharge. Pt feels fine.

## 2020-11-17 NOTE — Progress Notes (Signed)
Hematology/Oncology Consult note [facility]  Telephone:([phone] Fax:([phone]  Patient Care Team: Elaina Pattee, MD as PCP - General (Family Medicine)   Name of the patient: Jerry Espinoza  [id number]  1966/06/20   Date of visit: 11/17/20  Diagnosis-secondary polycythemia due to testosterone replacement therapy  Chief complaint/ Reason for visit-routine follow-up of secondary polycythemia  Heme/Onc history: patient is a 55 year old male who has been referred to [place] for evaluation and management of polycythemia. CBC done on 06/23/2017 showed white count of 7.4, H&H of 18.5/52.8 and platelet count 257. One month ago his H&H was 19.6/56.3. His past medical history is significant for hypertension and hyperlipidemia and hypogonadism. He is on testosterone on replacement therapy through a clinic in [place]. His hemoglobin has been between 17-18 units back in 2015. Serum free testosterone level on 06/07/2017 was low at0.89 and total testosterone was low at 37.  Results of blood work from 06/27/2017 were as follows: CBC showed a normal white count and the platelet count and H&H was elevated at 19.4/56.3 respectively.EPOlevels were normal at 5.6. Jak 2 mutation testing was negative. Urinalysis did not reveal any evidence of hematuria. Chest x-ray showed no active cardiopulmonary disease   Interval history-patient reports that he is on low-dose testosterone replacement therapy.  Reports having fatigue and head pressure and feels like he needs a phlebotomy today.  ECOG PS- 1 Pain scale- 0   Review of systems- Review of Systems  Constitutional: Positive for malaise/fatigue. Negative for chills, fever and weight loss.  HENT: Negative for congestion, ear discharge and nosebleeds.   Eyes: Negative for blurred vision.  Respiratory: Negative for cough, hemoptysis, sputum production, shortness of breath and wheezing.   Cardiovascular: Negative for  chest pain, palpitations, orthopnea and claudication.  Gastrointestinal: Negative for abdominal pain, blood in stool, constipation, diarrhea, heartburn, melena, nausea and vomiting.  Genitourinary: Negative for dysuria, flank pain, frequency, hematuria and urgency.  Musculoskeletal: Negative for back pain, joint pain and myalgias.  Skin: Negative for rash.  Neurological: Negative for dizziness, tingling, focal weakness, seizures, weakness and headaches.  Endo/Heme/Allergies: Does not bruise/bleed easily.  Psychiatric/Behavioral: Negative for depression and suicidal ideas. The patient does not have insomnia.       No Known Allergies   Past Medical History:  Diagnosis Date  . Cancer ([place])   . Ovarian cancer ([place])   . Polycythemia, secondary      History reviewed. No pertinent surgical history.  Social History   Socioeconomic History  . Marital status: Married    Spouse name: Not on file  . Number of children: Not on file  . Years of education: Not on file  . Highest education level: Not on file  Occupational History  . Not on file  Tobacco Use  . Smoking status: Former Smoker    Packs/day: 1.50    Years: 29.00    Pack years: 43.50    Quit date: 09/06/2008    Years since quitting: 12.2  . Smokeless tobacco: Never Used  Vaping Use  . Vaping Use: Never used  Substance and Sexual Activity  . Alcohol use: Not Currently  . Drug use: No  . Sexual activity: Not on file  Other Topics Concern  . Not on file  Social History Narrative  . Not on file   Social Determinants of Health   Financial Resource Strain: Not on file  Food Insecurity: Not on file  Transportation Needs: Not on file  Physical Activity: Not on  file  Stress: Not on file  Social Connections: Not on file  Intimate Partner Violence: Not on file    Family History  Problem Relation Age of Onset  . Cancer Father   . Cancer Sister      Current Outpatient Medications:  .  losartan (COZAAR) 100 MG  tablet, Take 100 mg by mouth daily., Disp: , Rfl:  .  probenecid (BENEMID) 500 MG tablet, Take 500 mg by mouth 2 (two) times daily., Disp: , Rfl:  .  TESTOSTERONE CYPIONATE IJ, Inject 1 Dose as directed 2 (two) times a week., Disp: , Rfl:   Physical exam:  Vitals:   11/17/20 1321  BP: (!) 141/93  Pulse: 86  Resp: 16  Temp: (!) 96.7 F (35.9 C)  TempSrc: Tympanic  SpO2: 100%  Weight: (!) 303 lb 11.2 oz (137.8 kg)   Physical Exam Constitutional:      General: He is not in acute distress. Cardiovascular:     Rate and Rhythm: Normal rate and regular rhythm.     Heart sounds: Normal heart sounds.  Pulmonary:     Effort: Pulmonary effort is normal.     Breath sounds: Normal breath sounds.  Abdominal:     General: Bowel sounds are normal.     Palpations: Abdomen is soft.  Skin:    General: Skin is warm and dry.  Neurological:     Mental Status: He is alert and oriented to person, place, and time.      No flowsheet data found. CBC Latest Ref Rng & Units 11/17/2020  WBC 4.0 - 10.5 K/uL 7.5  Hemoglobin 13.0 - 17.0 g/dL 17.2(H)  Hematocrit 39.0 - 52.0 % 51.0  Platelets 150 - 400 K/uL 203    Assessment and plan- Patient is a 55 y.o. 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  Hematocrit today is 51.  He will proceed with phlebotomy today as goal hematocrit is less than 50.  He will return for possible phlebotomy in 1 week as well.  Patient prefers to get his phlebotomy about every 4 to 5 weeks.  We will schedule him accordingly.  I will see him back in 10 weeks when he comes for phlebotomy   Visit Diagnosis 1. Polycythemia, secondary      Dr. Randa Evens, MD, MPH [facility] at [facility] [phone] 11/17/2020 4:45 PM

## 2020-11-18 LAB — TESTOSTERONE: Testosterone: 526 ng/dL (ref 264–916)

## 2020-11-21 ENCOUNTER — Telehealth: Payer: Self-pay | Admitting: Oncology

## 2020-11-21 NOTE — Telephone Encounter (Signed)
Pt's wife called to r/s pt's phleb appt from 3/21 to 3/24. MD aware.

## 2020-11-24 ENCOUNTER — Inpatient Hospital Stay: Payer: Self-pay

## 2020-11-27 ENCOUNTER — Other Ambulatory Visit: Payer: Self-pay

## 2020-11-27 ENCOUNTER — Inpatient Hospital Stay: Payer: Self-pay

## 2020-11-27 DIAGNOSIS — E781 Pure hyperglyceridemia: Secondary | ICD-10-CM | POA: Insufficient documentation

## 2020-11-27 DIAGNOSIS — D751 Secondary polycythemia: Secondary | ICD-10-CM

## 2020-11-27 LAB — HEMOGLOBIN AND HEMATOCRIT, BLOOD
HCT: 44.7 % (ref 39.0–52.0)
Hemoglobin: 15.2 g/dL (ref 13.0–17.0)

## 2020-11-27 NOTE — Progress Notes (Signed)
Pt's HCT (44.7) falls below parameters set by MD for phlebotomy today. Pt informed that no phlebotomy necessary today.

## 2020-12-22 ENCOUNTER — Inpatient Hospital Stay: Payer: Self-pay | Attending: Oncology

## 2020-12-22 ENCOUNTER — Inpatient Hospital Stay: Payer: Self-pay

## 2021-01-08 ENCOUNTER — Telehealth: Payer: Self-pay | Admitting: Oncology

## 2021-01-08 NOTE — Telephone Encounter (Signed)
Patient's wife called to r/s missed labs/Phleb appt on 4/18. Rescheduled for 5/5.

## 2021-01-09 ENCOUNTER — Other Ambulatory Visit: Payer: Self-pay

## 2021-01-09 ENCOUNTER — Inpatient Hospital Stay: Payer: Self-pay

## 2021-01-09 ENCOUNTER — Inpatient Hospital Stay: Payer: Self-pay | Attending: Oncology

## 2021-01-09 VITALS — BP 147/98 | HR 89 | Resp 18

## 2021-01-09 DIAGNOSIS — D751 Secondary polycythemia: Secondary | ICD-10-CM | POA: Insufficient documentation

## 2021-01-09 LAB — HEMOGLOBIN AND HEMATOCRIT, BLOOD
HCT: 50.6 % (ref 39.0–52.0)
Hemoglobin: 16.7 g/dL (ref 13.0–17.0)

## 2021-01-09 NOTE — Patient Instructions (Signed)

## 2021-01-26 ENCOUNTER — Inpatient Hospital Stay: Payer: Self-pay

## 2021-01-26 ENCOUNTER — Inpatient Hospital Stay: Payer: Self-pay | Admitting: Oncology

## 2021-02-27 ENCOUNTER — Ambulatory Visit: Payer: Self-pay | Admitting: Oncology

## 2021-02-27 ENCOUNTER — Other Ambulatory Visit: Payer: Self-pay

## 2021-02-27 DIAGNOSIS — Z87891 Personal history of nicotine dependence: Secondary | ICD-10-CM | POA: Insufficient documentation

## 2021-04-15 ENCOUNTER — Other Ambulatory Visit: Payer: Self-pay

## 2021-04-15 ENCOUNTER — Inpatient Hospital Stay: Payer: Self-pay | Attending: Oncology

## 2021-04-15 ENCOUNTER — Inpatient Hospital Stay: Payer: Self-pay

## 2021-04-15 DIAGNOSIS — D751 Secondary polycythemia: Secondary | ICD-10-CM

## 2021-04-15 LAB — HEMOGLOBIN AND HEMATOCRIT, BLOOD
HCT: 49.3 % (ref 39.0–52.0)
Hemoglobin: 16.4 g/dL (ref 13.0–17.0)

## 2021-05-08 ENCOUNTER — Inpatient Hospital Stay: Payer: Self-pay

## 2021-05-08 ENCOUNTER — Inpatient Hospital Stay: Payer: Self-pay | Attending: Oncology | Admitting: Oncology

## 2021-05-08 ENCOUNTER — Encounter: Payer: Self-pay | Admitting: Oncology

## 2021-05-08 VITALS — BP 143/90 | HR 75 | Temp 97.0°F | Resp 18 | Wt 308.9 lb

## 2021-05-08 DIAGNOSIS — D751 Secondary polycythemia: Secondary | ICD-10-CM

## 2021-05-08 DIAGNOSIS — I10 Essential (primary) hypertension: Secondary | ICD-10-CM | POA: Insufficient documentation

## 2021-05-08 DIAGNOSIS — E291 Testicular hypofunction: Secondary | ICD-10-CM | POA: Insufficient documentation

## 2021-05-08 DIAGNOSIS — R519 Headache, unspecified: Secondary | ICD-10-CM | POA: Insufficient documentation

## 2021-05-08 DIAGNOSIS — Z79899 Other long term (current) drug therapy: Secondary | ICD-10-CM | POA: Insufficient documentation

## 2021-05-08 DIAGNOSIS — E785 Hyperlipidemia, unspecified: Secondary | ICD-10-CM | POA: Insufficient documentation

## 2021-05-08 LAB — CBC WITH DIFFERENTIAL/PLATELET
Abs Immature Granulocytes: 0.06 10*3/uL (ref 0.00–0.07)
Basophils Absolute: 0 10*3/uL (ref 0.0–0.1)
Basophils Relative: 1 %
Eosinophils Absolute: 0.4 10*3/uL (ref 0.0–0.5)
Eosinophils Relative: 7 %
HCT: 47.6 % (ref 39.0–52.0)
Hemoglobin: 16 g/dL (ref 13.0–17.0)
Immature Granulocytes: 1 %
Lymphocytes Relative: 31 %
Lymphs Abs: 2 10*3/uL (ref 0.7–4.0)
MCH: 27.9 pg (ref 26.0–34.0)
MCHC: 33.6 g/dL (ref 30.0–36.0)
MCV: 82.9 fL (ref 80.0–100.0)
Monocytes Absolute: 0.5 10*3/uL (ref 0.1–1.0)
Monocytes Relative: 8 %
Neutro Abs: 3.3 10*3/uL (ref 1.7–7.7)
Neutrophils Relative %: 52 %
Platelets: 189 10*3/uL (ref 150–400)
RBC: 5.74 MIL/uL (ref 4.22–5.81)
RDW: 14.4 % (ref 11.5–15.5)
WBC: 6.3 10*3/uL (ref 4.0–10.5)
nRBC: 0 % (ref 0.0–0.2)

## 2021-05-11 ENCOUNTER — Encounter: Payer: Self-pay | Admitting: Oncology

## 2021-05-11 NOTE — Progress Notes (Signed)
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 CBC done on 06/23/2017 showed white count of 7.4, H&H of 18.5/52.8 and platelet count 257. One month ago his H&H was 19.6/56.3. His past medical history is significant for hypertension and hyperlipidemia and hypogonadism. He is on testosterone on replacement therapy through pcp in [place]. His hemoglobin has been between 17-18 units back in 2015. Serum free testosterone level on 06/07/2017 was low at0.89 and total testosterone was low at 37.    Results of blood work from 06/27/2017 were as follows: CBC showed a normal white count and the platelet count and H&H was elevated at 19.4/56.3 respectively.  EPO levels were normal at 5.6.  Jak 2 mutation testing was negative.  Urinalysis did not reveal any evidence of hematuria.  Chest x-ray showed no active cardiopulmonary disease     Interval history-patient reports ongoing fatigue and occasional headaches and feels like he would be in need of a phlebotomy at this time.  Tolerating testosterone supplements well otherwise.  He gets it through a clinic in [place].  ECOG PS- 1 Pain scale- 0   Review of systems- Review of Systems  Constitutional:  Negative for chills, fever, malaise/fatigue and weight loss.  HENT:  Negative for congestion, ear discharge and nosebleeds.   Eyes:  Negative for blurred vision.  Respiratory:  Negative for cough, hemoptysis, sputum production,  shortness of breath and wheezing.   Cardiovascular:  Negative for chest pain, palpitations, orthopnea and claudication.  Gastrointestinal:  Negative for abdominal pain, blood in stool, constipation, diarrhea, heartburn, melena, nausea and vomiting.  Genitourinary:  Negative for dysuria, flank pain, frequency, hematuria and urgency.  Musculoskeletal:  Negative for back pain, joint pain and myalgias.  Skin:  Negative for rash.  Neurological:  Negative for dizziness, tingling, focal weakness, seizures, weakness and headaches.  Endo/Heme/Allergies:  Does not bruise/bleed easily.  Psychiatric/Behavioral:  Negative for depression and suicidal ideas. The patient does not have insomnia.      No Known Allergies   Past Medical History:  Diagnosis Date   Cancer ([place])    Ovarian cancer ([place])    Polycythemia, secondary      History reviewed. No pertinent surgical history.  Social History   Socioeconomic History   Marital status: Married    Spouse name: Not on file   Number of children: Not on file   Years of education: Not on file   Highest education level: Not on file  Occupational History   Not on file  Tobacco Use   Smoking status: Former    Packs/day: 1.50    Years: 29.00    Pack years: 43.50    Types: Cigarettes    Quit date: 09/06/2008    Years since quitting: 12.6   Smokeless tobacco: Never  Vaping Use   Vaping Use: Never used  Substance and Sexual Activity   Alcohol use: Not Currently   Drug use: No   Sexual activity: Not on file  Other Topics Concern   Not on file  Social History  Narrative   Not on file   Social Determinants of Health   Financial Resource Strain: Not on file  Food Insecurity: Not on file  Transportation Needs: Not on file  Physical Activity: Not on file  Stress: Not on file  Social Connections: Not on file  Intimate Partner Violence: Not on file    Family History  Problem Relation Age of Onset   Cancer Father    Cancer Sister       Current Outpatient Medications:    atorvastatin (LIPITOR) 20 MG tablet, Take 20 mg by mouth daily., Disp: , Rfl:    fluticasone (FLONASE) 50 MCG/ACT nasal spray, PLACE 1 SPRAY INTO BOTH NOSTRILS 2 (TWO) TIMES DAILY, Disp: , Rfl:    losartan (COZAAR) 100 MG tablet, Take 100 mg by mouth daily., Disp: , Rfl:    losartan (COZAAR) 50 MG tablet, Take 50 mg by mouth daily., Disp: , Rfl:    probenecid (BENEMID) 500 MG tablet, Take 500 mg by mouth 2 (two) times daily., Disp: , Rfl:    TESTOSTERONE CYPIONATE IJ, Inject 1 Dose as directed 2 (two) times a week., Disp: , Rfl:   Physical exam:  Vitals:   05/08/21 1247  BP: (!) 143/90  Pulse: 75  Resp: 18  Temp: (!) 97 F (36.1 C)  SpO2: 97%  Weight: (!) 308 lb 14.4 oz (140.1 kg)   Physical Exam Constitutional:      General: He is not in acute distress. Cardiovascular:     Rate and Rhythm: Normal rate and regular rhythm.     Heart sounds: Normal heart sounds.  Pulmonary:     Effort: Pulmonary effort is normal.     Breath sounds: Normal breath sounds.  Abdominal:     General: Bowel sounds are normal.     Palpations: Abdomen is soft.  Skin:    General: Skin is warm and dry.  Neurological:     Mental Status: He is alert and oriented to person, place, and time.     No flowsheet data found. CBC Latest Ref Rng & Units 05/08/2021  WBC 4.0 - 10.5 K/uL 6.3  Hemoglobin 13.0 - 17.0 g/dL 16.0  Hematocrit 39.0 - 52.0 % 47.6  Platelets 150 - 400 K/uL 189     Assessment and plan- Patient is a 55 y.o. 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  Today his H&H is 16/47.6.  He therefore does not require a phlebotomy at this time.  We will continue to monitor his H&H every 6 weeks and I will see him back in 18 weeks for possible phlebotomy   Visit Diagnosis 1. Polycythemia, secondary      Dr. Randa Evens, MD, MPH [facility] at  [facility] [id number] 05/11/2021 9:24 AM

## 2021-06-18 ENCOUNTER — Inpatient Hospital Stay: Payer: Self-pay

## 2021-07-14 ENCOUNTER — Telehealth: Payer: Self-pay | Admitting: Oncology

## 2021-07-14 NOTE — Telephone Encounter (Signed)
Jerry Espinoza called the pt. And rescheduled his visit

## 2021-07-14 NOTE — Telephone Encounter (Signed)
Pt called to check to see when his appt. Was. Told him 11-28. Pt states that he needs to be seen before then. His face is starting to swell. Please give him a call ASAP at [phone]

## 2021-07-23 ENCOUNTER — Inpatient Hospital Stay: Payer: Self-pay

## 2021-08-03 ENCOUNTER — Inpatient Hospital Stay: Payer: Self-pay

## 2021-08-07 ENCOUNTER — Telehealth: Payer: Self-pay | Admitting: Oncology

## 2021-08-07 NOTE — Telephone Encounter (Signed)
Wife called in to see if pt could get a phlebotomy today or Monday.Please return her call at [phone]

## 2021-08-07 NOTE — Telephone Encounter (Signed)
Returned phone call to patient's wife. She left a message this morning requesting that patient have phlebotomy today or Monday. No answer on her cell phone an unable to leave VM.   Left VM on patient's home phone with appointment information.

## 2021-08-10 ENCOUNTER — Inpatient Hospital Stay: Payer: Self-pay | Attending: Oncology

## 2021-08-10 ENCOUNTER — Inpatient Hospital Stay: Payer: Self-pay

## 2021-09-11 ENCOUNTER — Inpatient Hospital Stay: Payer: Self-pay

## 2021-09-11 ENCOUNTER — Encounter: Payer: Self-pay | Admitting: Oncology

## 2021-09-11 ENCOUNTER — Inpatient Hospital Stay: Payer: Self-pay | Admitting: Oncology

## 2021-09-29 ENCOUNTER — Telehealth: Payer: Self-pay | Admitting: Oncology

## 2021-09-29 NOTE — Telephone Encounter (Signed)
Wife called to make an appt for pt. Call back at [phone]

## 2021-09-29 NOTE — Telephone Encounter (Signed)
Spoke with patient's wife. He cancelled his appointment on 1/6. Got him rescheduled for lab/NP/Possible pheb on 1/30.   Thanks!

## 2021-10-01 ENCOUNTER — Inpatient Hospital Stay: Payer: Self-pay | Attending: Oncology

## 2021-10-01 ENCOUNTER — Inpatient Hospital Stay: Payer: Self-pay

## 2021-10-01 ENCOUNTER — Other Ambulatory Visit: Payer: Self-pay

## 2021-10-01 ENCOUNTER — Inpatient Hospital Stay (HOSPITAL_BASED_OUTPATIENT_CLINIC_OR_DEPARTMENT_OTHER): Payer: Self-pay | Admitting: Nurse Practitioner

## 2021-10-01 ENCOUNTER — Encounter: Payer: Self-pay | Admitting: Nurse Practitioner

## 2021-10-01 VITALS — BP 166/108 | HR 106 | Temp 98.7°F | Resp 18 | Wt 291.7 lb

## 2021-10-01 VITALS — BP 155/107 | HR 101

## 2021-10-01 DIAGNOSIS — Z79890 Hormone replacement therapy (postmenopausal): Secondary | ICD-10-CM

## 2021-10-01 DIAGNOSIS — Z87891 Personal history of nicotine dependence: Secondary | ICD-10-CM | POA: Insufficient documentation

## 2021-10-01 DIAGNOSIS — I10 Essential (primary) hypertension: Secondary | ICD-10-CM

## 2021-10-01 DIAGNOSIS — D751 Secondary polycythemia: Secondary | ICD-10-CM

## 2021-10-01 LAB — HEMOGLOBIN AND HEMATOCRIT, BLOOD
HCT: 56.9 % — ABNORMAL HIGH (ref 39.0–52.0)
Hemoglobin: 18.6 g/dL — ABNORMAL HIGH (ref 13.0–17.0)

## 2021-10-01 NOTE — Progress Notes (Signed)
Per [place] per Beckey Rutter NP labs and VS (including B/P 166/108) have been reviewed pt to receive 500cc phlebotomy and okay to discharge home post phlebotomy despite B/P pt has appt with PCP 10/02/21 for B/P.    1622: Pt tolerated phlebotomy well. Pt stable at discharge.

## 2021-10-01 NOTE — Progress Notes (Signed)
Hematology/Oncology Consult Note 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 CBC done on 06/23/2017 showed white count of 7.4, H&H of 18.5/52.8 and platelet count 257. One month ago his H&H was 19.6/56.3. His past medical history is significant for hypertension and hyperlipidemia and hypogonadism. He is on testosterone on replacement therapy through pcp in [place]. His hemoglobin has been between 17-18 units back in 2015. Serum free testosterone level on 06/07/2017 was low at0.89 and total testosterone was low at 37.    Results of blood work from 06/27/2017 were as follows: CBC showed a normal white count and the platelet count and H&H was elevated at 19.4/56.3 respectively.  EPO levels were normal at 5.6.  Jak 2 mutation testing was negative.  Urinalysis did not reveal any evidence of hematuria.  Chest x-ray showed no active cardiopulmonary disease     Interval history-patient reports ongoing fatigue and occasional headaches and feels like he would be in need of a phlebotomy at this time. He has missed several appointments after he was within goal. He continues to tolerate testosterone supplements well otherwise.  He gets it through a clinic in [place].  ECOG PS- 1 Pain scale- 0   Review of systems- Review of Systems  Constitutional:  Positive for malaise/fatigue. Negative for chills, fever and weight loss.  HENT:  Negative  for congestion, ear discharge and nosebleeds.   Eyes:  Negative for blurred vision.  Respiratory:  Negative for cough, hemoptysis, sputum production, shortness of breath and wheezing.   Cardiovascular:  Negative for chest pain, palpitations, orthopnea and claudication.       Hypertension  Gastrointestinal:  Negative for abdominal pain, blood in stool, constipation, diarrhea, heartburn, melena, nausea and vomiting.  Genitourinary:  Negative for dysuria, flank pain, frequency, hematuria and urgency.  Musculoskeletal:  Positive for joint pain. Negative for back pain and myalgias.  Skin:  Negative for rash.  Neurological:  Positive for headaches. Negative for dizziness, tingling, focal weakness, seizures and weakness.  Endo/Heme/Allergies:  Does not bruise/bleed easily.  Psychiatric/Behavioral:  Negative for depression and suicidal ideas. The patient does not have insomnia.      No Known Allergies   Past Medical History:  Diagnosis Date   Cancer ([place])    Ovarian cancer ([place])    Polycythemia, secondary      No past surgical history on file.  Social History   Socioeconomic History   Marital status: Married    Spouse name: Not on file   Number of children: Not on file   Years of education: Not on file   Highest education level: Not on file  Occupational History   Not on file  Tobacco Use   Smoking status: Former    Packs/day: 1.50    Years: 29.00    Pack years: 43.50    Types: Cigarettes    Quit date: 09/06/2008    Years since quitting: 13.0   Smokeless tobacco: Never  Vaping Use   Vaping Use: Never used  Substance  and Sexual Activity   Alcohol use: Not Currently   Drug use: No   Sexual activity: Not on file  Other Topics Concern   Not on file  Social History Narrative   Not on file   Social Determinants of Health   Financial Resource Strain: Not on file  Food Insecurity: Not on file  Transportation Needs: Not on file  Physical Activity: Not on file  Stress: Not  on file  Social Connections: Not on file  Intimate Partner Violence: Not on file    Family History  Problem Relation Age of Onset   Cancer Father    Cancer Sister      Current Outpatient Medications:    atorvastatin (LIPITOR) 20 MG tablet, Take 20 mg by mouth daily., Disp: , Rfl:    fluticasone (FLONASE) 50 MCG/ACT nasal spray, PLACE 1 SPRAY INTO BOTH NOSTRILS 2 (TWO) TIMES DAILY, Disp: , Rfl:    losartan (COZAAR) 100 MG tablet, Take 100 mg by mouth daily., Disp: , Rfl:    losartan (COZAAR) 50 MG tablet, Take 50 mg by mouth daily., Disp: , Rfl:    probenecid (BENEMID) 500 MG tablet, Take 500 mg by mouth 2 (two) times daily., Disp: , Rfl:    TESTOSTERONE CYPIONATE IJ, Inject 1 Dose as directed 2 (two) times a week., Disp: , Rfl:   Physical exam:  Vitals:   10/01/21 1507  BP: (!) 166/108  Pulse: (!) 106  Resp: 18  Temp: 98.7 F (37.1 C)  SpO2: 97%  Weight: 291 lb 11.2 oz (132.3 kg)   Physical Exam Constitutional:      General: He is not in acute distress. Cardiovascular:     Rate and Rhythm: Normal rate and regular rhythm.     Heart sounds: Normal heart sounds.  Pulmonary:     Effort: Pulmonary effort is normal.     Breath sounds: Normal breath sounds.  Abdominal:     General: Bowel sounds are normal.     Palpations: Abdomen is soft.  Skin:    General: Skin is warm and dry.  Neurological:     Mental Status: He is alert and oriented to person, place, and time.     No flowsheet data found. CBC Latest Ref Rng & Units 10/01/2021  WBC 4.0 - 10.5 K/uL -  Hemoglobin 13.0 - 17.0 g/dL 18.6(H)  Hematocrit 39.0 - 52.0 % 56.9(H)  Platelets 150 - 400 K/uL -     Assessment and plan- Patient is a 56 y.o. male with secondary polycythemia from testosterone replacement therapy here for possible phlebotomy  Reviewed that goal hematocrit < 50. He is 56.9 today and symptomatic. Proceed with phlebotomy today.   Hypertension- likely secondary to increase hematocrit. I've asked him  to monitor pressure at home and follow up with his pcp.   Phlebotomy today 4 weeks- lab (H&H) +/- phlebotomy 8 weeks- lab (H&H) +/- phlebotomy 12 weeks- lab (cbc), Janese Banks, +/- phlebotomy- la   Visit Diagnosis 1. Polycythemia, secondary   2. Long-term current use of testosterone replacement therapy    Beckey Rutter, DNP, AGNP-C [place] at [facility] [phone] (clinic) 10/01/2021  CC: Dr. Andree Elk

## 2021-10-02 ENCOUNTER — Ambulatory Visit: Payer: Self-pay | Admitting: Nurse Practitioner

## 2021-10-02 ENCOUNTER — Other Ambulatory Visit: Payer: Self-pay

## 2021-10-02 NOTE — Progress Notes (Signed)
Jerry Espinoza spoke to his wife and she was saying that he does not come sometimes so we can't keep making appts and he not come. He has an appt 2/3 for labs and treatment. Jerry Espinoza will call wife after results come back and schedule the next appt with each time.

## 2021-10-05 ENCOUNTER — Other Ambulatory Visit: Payer: Self-pay

## 2021-10-05 ENCOUNTER — Ambulatory Visit: Payer: Self-pay | Admitting: Nurse Practitioner

## 2021-10-05 ENCOUNTER — Telehealth: Payer: Self-pay | Admitting: Oncology

## 2021-10-05 ENCOUNTER — Inpatient Hospital Stay: Payer: Self-pay

## 2021-10-05 VITALS — BP 140/90 | HR 101 | Temp 98.5°F | Resp 17 | Wt 288.0 lb

## 2021-10-05 DIAGNOSIS — D751 Secondary polycythemia: Secondary | ICD-10-CM

## 2021-10-05 LAB — HEMOGLOBIN AND HEMATOCRIT, BLOOD
HCT: 50.1 % (ref 39.0–52.0)
Hemoglobin: 16.3 g/dL (ref 13.0–17.0)

## 2021-10-05 NOTE — Patient Instructions (Signed)

## 2021-10-05 NOTE — Progress Notes (Signed)
HCT 50.1. Removed 500 ml of blood per phlebotomy orders. Pt reports feeling so much better after phlebotomy complete. Pt accepted a beverage. VSS. Discharged to home.

## 2021-10-05 NOTE — Telephone Encounter (Signed)
Pt called to see if he could be seen earlier, like today. Call back at [phone]

## 2021-10-09 ENCOUNTER — Other Ambulatory Visit: Payer: Self-pay

## 2021-10-12 ENCOUNTER — Inpatient Hospital Stay: Payer: Self-pay

## 2021-10-12 ENCOUNTER — Inpatient Hospital Stay: Payer: Self-pay | Attending: Oncology

## 2021-10-29 ENCOUNTER — Inpatient Hospital Stay: Payer: Self-pay | Attending: Oncology

## 2021-10-29 ENCOUNTER — Inpatient Hospital Stay: Payer: Self-pay

## 2021-11-25 ENCOUNTER — Inpatient Hospital Stay: Payer: Self-pay

## 2021-11-25 ENCOUNTER — Inpatient Hospital Stay: Payer: Self-pay | Attending: Oncology

## 2021-12-17 ENCOUNTER — Telehealth: Payer: Self-pay | Admitting: *Deleted

## 2021-12-17 NOTE — Telephone Encounter (Signed)
Ok to schedule for weekly phlebotomy and H/H each week until hct <50

## 2021-12-17 NOTE — Telephone Encounter (Signed)
Patient called stating that he saw his PCP yesterday and had labs drawn and his HCT was 58 and he know his cut off for phlebotomy is 49. He is asking to come in for a phlebotomy ASAP and not wait until his 12/24/21 appointment. Please advise ? ?Complete Blood Count (CBC) with Differential ?Order: [id number] ? Ref Range & Units 2 d ago  ?WBC (White Blood Cell Count) 3.2 - 9.8 x10?9/L 7.2   ?Hemoglobin 13.7 - 17.3 g/dL 15.8   ?Hematocrit 39.0 - 49.0 % 52.2 High    ?Platelets 150 - 450 x10?9/L 230   ?MCV (Mean Corpuscular Volume) 80 - 98 fL 80   ?MCH (Mean Corpuscular Hemoglobin) 26.5 - 34.0 pg 24.3 Low    ?MCHC (Mean Corpuscular Hemoglobin Concentration) 31.5 - 36.3 % 30.3 Low    ?RBC (Red Blood Cell Count) 4.37 - 5.74 x10?12/L 6.49 High    ?RDW-CV (Red Cell Distribution Width) 11.5 - 14.5 % 15.4 High    ?NRBC (Nucleated Red Blood Cell Count) 0 x10?9/L 0.00   ?NRBC % (Nucleated Red Blood Cell %) % 0.0   ?MPV (Mean Platelet Volume) 7.2 - 11.7 fL 10.2   ?Neutrophil Count 2.0 - 8.6 x10?9/L 4.5   ?Neutrophil % 37 - 80 % 62.5   ?Lymphocyte Count 0.6 - 4.2 x10?9/L 1.4   ?Lymphocyte % 10 - 50 % 19.1   ?Monocyte Count 0 - 0.9 x10?9/L 0.8   ?Monocyte % 0 - 12 % 10.5   ?Eosinophil Count 0 - 0.70 x10?9/L 0.43   ?Eosinophil % 0 - 7 % 5.9   ?Basophil Count 0 - 0.20 x10?9/L 0.06   ?Basophil % 0 - 2 % 0.8   ?Immature Granulocyte Count <=0.06 x10?9/L 0.09 High    ?Immature Granulocyte % <=0.7 % 1.2 High    ?[place]  ?Specimen Collected: 12/15/21 12:13 Last Resulted: 12/15/21 20:52  ?Received From: [facility]  Result Received: 12/17/21 08:40  ? ?

## 2021-12-18 ENCOUNTER — Inpatient Hospital Stay: Payer: Self-pay

## 2021-12-18 ENCOUNTER — Telehealth: Payer: Self-pay | Admitting: *Deleted

## 2021-12-18 ENCOUNTER — Inpatient Hospital Stay: Payer: Self-pay | Attending: Oncology

## 2021-12-18 DIAGNOSIS — Z87891 Personal history of nicotine dependence: Secondary | ICD-10-CM | POA: Insufficient documentation

## 2021-12-18 DIAGNOSIS — T387X5A Adverse effect of androgens and anabolic congeners, initial encounter: Secondary | ICD-10-CM | POA: Insufficient documentation

## 2021-12-18 DIAGNOSIS — D751 Secondary polycythemia: Secondary | ICD-10-CM | POA: Insufficient documentation

## 2021-12-18 DIAGNOSIS — I10 Essential (primary) hypertension: Secondary | ICD-10-CM | POA: Insufficient documentation

## 2021-12-18 DIAGNOSIS — Z809 Family history of malignant neoplasm, unspecified: Secondary | ICD-10-CM | POA: Insufficient documentation

## 2021-12-18 NOTE — Telephone Encounter (Signed)
Pt called with an urgent request to be added on for a phlebotomy visit ASAP as his HCT was 52 at his PCP office. We worked him onto schedule today at 1pm for lab/ 1:30 pm for phlebotomy. Pt was a no show. Patient has a history of not coming in for scheduled appointments and then calls requesting emergent visits. ?

## 2021-12-24 ENCOUNTER — Inpatient Hospital Stay: Payer: Self-pay

## 2021-12-24 ENCOUNTER — Inpatient Hospital Stay: Payer: Self-pay | Admitting: Nurse Practitioner

## 2021-12-29 ENCOUNTER — Telehealth: Payer: Self-pay | Admitting: *Deleted

## 2021-12-29 NOTE — Telephone Encounter (Signed)
Message from wife asking to reschedule patient appointments. ?

## 2021-12-31 ENCOUNTER — Other Ambulatory Visit: Payer: Self-pay | Admitting: Emergency Medicine

## 2021-12-31 DIAGNOSIS — D751 Secondary polycythemia: Secondary | ICD-10-CM

## 2022-01-01 ENCOUNTER — Inpatient Hospital Stay (HOSPITAL_BASED_OUTPATIENT_CLINIC_OR_DEPARTMENT_OTHER): Payer: Self-pay | Admitting: Nurse Practitioner

## 2022-01-01 ENCOUNTER — Inpatient Hospital Stay: Payer: Self-pay

## 2022-01-01 VITALS — BP 134/97 | HR 88 | Temp 98.2°F | Resp 17 | Ht 72.0 in | Wt 289.5 lb

## 2022-01-01 DIAGNOSIS — D751 Secondary polycythemia: Secondary | ICD-10-CM

## 2022-01-01 LAB — CBC WITH DIFFERENTIAL/PLATELET
Abs Immature Granulocytes: 0.05 10*3/uL (ref 0.00–0.07)
Basophils Absolute: 0 10*3/uL (ref 0.0–0.1)
Basophils Relative: 1 %
Eosinophils Absolute: 0 10*3/uL (ref 0.0–0.5)
Eosinophils Relative: 0 %
HCT: 50.1 % (ref 39.0–52.0)
Hemoglobin: 15.7 g/dL (ref 13.0–17.0)
Immature Granulocytes: 1 %
Lymphocytes Relative: 15 %
Lymphs Abs: 1.2 10*3/uL (ref 0.7–4.0)
MCH: 24.2 pg — ABNORMAL LOW (ref 26.0–34.0)
MCHC: 31.3 g/dL (ref 30.0–36.0)
MCV: 77.2 fL — ABNORMAL LOW (ref 80.0–100.0)
Monocytes Absolute: 0.6 10*3/uL (ref 0.1–1.0)
Monocytes Relative: 8 %
Neutro Abs: 5.9 10*3/uL (ref 1.7–7.7)
Neutrophils Relative %: 75 %
Platelets: 327 10*3/uL (ref 150–400)
RBC: 6.49 MIL/uL — ABNORMAL HIGH (ref 4.22–5.81)
RDW: 16.1 % — ABNORMAL HIGH (ref 11.5–15.5)
WBC: 7.9 10*3/uL (ref 4.0–10.5)
nRBC: 0 % (ref 0.0–0.2)

## 2022-01-01 NOTE — Progress Notes (Signed)
Therapeutic phlebotomy performed in right hand using 20g angiocath. 567m removed. Pt tolerated procedure well. Oral hydration provided. Vital signs stable at discharge.  ?

## 2022-01-01 NOTE — Progress Notes (Signed)
? ?Hematology/Oncology Consult Note ?[place]  ?[phone] [phone] ? ?Patient Care Team: ?Elaina Pattee, MD as PCP - General (Family Medicine) ?Sindy Guadeloupe, MD as Consulting Physician (Hematology and Oncology)  ? ?Name of the patient: Jerry Espinoza  ?[id number]  ?Dec 07, 1965  ? ?Date of visit: 01/01/22 ? 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 ?Chief complaint/ Reason for visit-routine follow-up of secondary polycythemia for possible phlebotomy ? ?Heme/Onc history:  ?patient is a 56 year old male who has been referred to [place] for evaluation and management of polycythemia. CBC done on 06/23/2017 showed white count of 7.4, H&H of 18.5/52.8 and platelet count 257. One month ago his H&H was 19.6/56.3. His past medical history is significant for hypertension and hyperlipidemia and hypogonadism. He is on testosterone on replacement therapy through pcp in [place]. His hemoglobin has been between 17-18 units back in 2015. Serum free testosterone level on 06/07/2017 was low at0.89 and total testosterone was low at 37.  ?  ?Results of blood work from 06/27/2017 were as follows: CBC showed a normal white count and the platelet count and H&H was elevated at 19.4/56.3 respectively.  EPO levels were normal at 5.6.  Jak 2 mutation testing was negative.  Urinalysis did not reveal any evidence of hematuria.  Chest x-ray showed no active cardiopulmonary disease ?  ?  ?Interval history-patient is 56 year old male who returns to clinic for repeat laboratory evaluation and follow-up and consideration of therapeutic phlebotomy for ongoing management of secondary polycythemia in the setting of chronic testosterone replacement.  Reports feeling fatigued and having headaches.  Has noticed red complexion.  Feels that he needs phlebotomy.  Saw PCP and his hematocrit was around 52. ? ?ECOG PS- 1 ?Pain scale- 0 ? ? ?Review of systems- Review of Systems   ?Constitutional:  Positive for malaise/fatigue. Negative for chills, fever and weight loss.  ?HENT:  Negative for congestion, ear discharge and nosebleeds.   ?Eyes:  Negative for blurred vision.  ?Respiratory:  Negative for cough, hemoptysis, sputum production, shortness of breath and wheezing.   ?Cardiovascular:  Negative for chest pain, palpitations, orthopnea and claudication.  ?     Hypertension  ?Gastrointestinal:  Negative for abdominal pain, blood in stool, constipation, diarrhea, heartburn, melena, nausea and vomiting.  ?Genitourinary:  Negative for dysuria, flank pain, frequency, hematuria and urgency.  ?Musculoskeletal:  Positive for joint pain. Negative for back pain and myalgias.  ?Skin:  Negative for rash.  ?Neurological:  Positive for headaches. Negative for dizziness, tingling, focal weakness, seizures and weakness.  ?Endo/Heme/Allergies:  Does not bruise/bleed easily.  ?Psychiatric/Behavioral:  Negative for depression and suicidal ideas. The patient does not have insomnia.    ? ? ?No Known Allergies ? ? ?Past Medical History:  ?Diagnosis Date  ? Cancer [facility])   ? Ovarian cancer ([place])   ? Polycythemia, secondary   ? ? ? ?No past surgical history on file. ? ?Social History  ? ?Socioeconomic History  ? Marital status: Married  ?  Spouse name: Not on file  ? Number of children: Not on file  ? Years of education: Not on file  ? Highest education level: Not on file  ?Occupational History  ? Not on file  ?Tobacco Use  ? Smoking status: Former  ?  Packs/day: 1.50  ?  Years: 29.00  ?  Pack years: 43.50  ?  Types: Cigarettes  ?  Quit date: 09/06/2008  ?  Years since quitting: 13.3  ? Smokeless tobacco: Never  ?  Vaping Use  ? Vaping Use: Never used  ?Substance and Sexual Activity  ? Alcohol use: Not Currently  ? Drug use: No  ? Sexual activity: Not on file  ?Other Topics Concern  ? Not on file  ?Social History Narrative  ? Not on file  ? ?Social Determinants of Health  ? ?Financial Resource Strain: Not on file   ?Food Insecurity: Not on file  ?Transportation Needs: Not on file  ?Physical Activity: Not on file  ?Stress: Not on file  ?Social Connections: Not on file  ?Intimate Partner Violence: Not on file  ? ? ?Family History  ?Problem Relation Age of Onset  ? Cancer Father   ? Cancer Sister   ? ? ? ?Current Outpatient Medications:  ?  losartan (COZAAR) 100 MG tablet, Take 100 mg by mouth daily., Disp: , Rfl:  ?  probenecid (BENEMID) 500 MG tablet, Take 500 mg by mouth 2 (two) times daily., Disp: , Rfl:  ?  TESTOSTERONE CYPIONATE IJ, Inject 1 Dose as directed 2 (two) times a week., Disp: , Rfl:  ?  atorvastatin (LIPITOR) 20 MG tablet, Take 20 mg by mouth daily. (Patient not taking: Reported on 10/01/2021), Disp: , Rfl:  ?  fluticasone (FLONASE) 50 MCG/ACT nasal spray, PLACE 1 SPRAY INTO BOTH NOSTRILS 2 (TWO) TIMES DAILY (Patient not taking: Reported on 10/01/2021), Disp: , Rfl:  ?  losartan (COZAAR) 50 MG tablet, Take 50 mg by mouth daily. (Patient not taking: Reported on 10/01/2021), Disp: , Rfl:  ? ?Physical exam:  ?Vitals:  ? 01/01/22 1250  ?BP: (!) 134/97  ?Pulse: 88  ?Resp: 17  ?Temp: 98.2 ?F (36.8 ?C)  ?SpO2: 99%  ?Weight: 289 lb 8 oz (131.3 kg)  ?Height: 6' (1.829 m)  ? ?Physical Exam ?Constitutional:   ?   General: He is not in acute distress. ?Cardiovascular:  ?   Rate and Rhythm: Normal rate and regular rhythm.  ?   Heart sounds: Normal heart sounds.  ?Pulmonary:  ?   Effort: Pulmonary effort is normal.  ?   Breath sounds: Normal breath sounds.  ?Abdominal:  ?   General: Bowel sounds are normal.  ?   Palpations: Abdomen is soft.  ?Skin: ?   General: Skin is warm and dry.  ?   Comments: Ruddy complexion  ?Neurological:  ?   Mental Status: He is alert and oriented to person, place, and time.  ?  ? ?   ? View : No data to display.  ?  ?  ?  ? ? ?  Latest Ref Rng & Units 01/01/2022  ? 12:31 PM  ?CBC  ?WBC 4.0 - 10.5 K/uL 7.9    ?Hemoglobin 13.0 - 17.0 g/dL 15.7    ?Hematocrit 39.0 - 52.0 % 50.1    ?Platelets 150 - 400  K/uL 327    ? ? ? ?Assessment and plan- Patient is a 56 y.o. male with secondary polycythemia from testosterone replacement therapy here for possible phlebotomy ? ?Reviewed that goal hematocrit < 50. He is 50.1 today and symptomatic. Proceed with phlebotomy today of 500 cc.  ? ?Hypertension- likely secondary to increase hematocrit. I've asked him to monitor pressure at home and follow up with his pcp.  ? ?Phlebotomy today ?8 weeks- lab (H&H) +/- phlebotomy ?16 weeks- lab (cbc), Janese Banks, +/- phlebotomy- la ?  ?Visit Diagnosis ?1. Polycythemia, secondary   ? ?Beckey Rutter, DNP, AGNP-C ?[place] at [facility] ?[phone] (clinic) ?01/01/2022 ?

## 2022-01-01 NOTE — Patient Instructions (Signed)

## 2022-02-26 ENCOUNTER — Inpatient Hospital Stay: Payer: Self-pay | Attending: Oncology

## 2022-02-26 ENCOUNTER — Inpatient Hospital Stay: Payer: Self-pay

## 2022-04-26 ENCOUNTER — Inpatient Hospital Stay: Payer: Self-pay

## 2022-04-26 ENCOUNTER — Encounter: Payer: Self-pay | Admitting: Medical Oncology

## 2022-04-26 ENCOUNTER — Inpatient Hospital Stay: Payer: Self-pay | Attending: Oncology | Admitting: Medical Oncology

## 2022-04-26 VITALS — BP 148/102 | HR 104 | Temp 98.5°F | Resp 18 | Ht 72.0 in | Wt 301.0 lb

## 2022-04-26 VITALS — BP 125/84 | HR 98 | Resp 18

## 2022-04-26 DIAGNOSIS — D751 Secondary polycythemia: Secondary | ICD-10-CM

## 2022-04-26 DIAGNOSIS — Z87891 Personal history of nicotine dependence: Secondary | ICD-10-CM | POA: Insufficient documentation

## 2022-04-26 DIAGNOSIS — E785 Hyperlipidemia, unspecified: Secondary | ICD-10-CM | POA: Insufficient documentation

## 2022-04-26 DIAGNOSIS — Z79890 Hormone replacement therapy (postmenopausal): Secondary | ICD-10-CM

## 2022-04-26 DIAGNOSIS — R519 Headache, unspecified: Secondary | ICD-10-CM | POA: Insufficient documentation

## 2022-04-26 DIAGNOSIS — I10 Essential (primary) hypertension: Secondary | ICD-10-CM | POA: Insufficient documentation

## 2022-04-26 LAB — HEMOGLOBIN AND HEMATOCRIT, BLOOD
HCT: 49.9 % (ref 39.0–52.0)
Hemoglobin: 15.9 g/dL (ref 13.0–17.0)

## 2022-04-26 NOTE — Progress Notes (Signed)
Per PA Sarah, proceeded with 500 ml phlebotomy today. Pt tolerated procedure well. Accepted a beverage. VSS. Feeling much better at time of discharge.

## 2022-04-26 NOTE — Progress Notes (Signed)
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 CBC done on 06/23/2017 showed white count of 7.4, H&H of 18.5/52.8 and platelet count 257. One month ago his H&H was 19.6/56.3. His past medical history is significant for hypertension and hyperlipidemia and hypogonadism. He is on testosterone on replacement therapy through pcp in [place]. His hemoglobin has been between 17-18 units back in 2015. Serum free testosterone level on 06/07/2017 was low at0.89 and total testosterone was low at 37.    Results of blood work from 06/27/2017 were as follows: CBC showed a normal white count and the platelet count and H&H was elevated at 19.4/56.3 respectively.  EPO levels were normal at 5.6.  Jak 2 mutation testing was negative.  Urinalysis did not reveal any evidence of hematuria.  Chest x-ray showed no active cardiopulmonary disease     Interval history-patient is 56 year old male who returns to clinic for repeat laboratory evaluation and follow-up and consideration of therapeutic phlebotomy for ongoing management of secondary polycythemia in the setting of chronic testosterone replacement. Patient reports that he has started to have headaches again- this occurs when his HCT is high per patient. He is really hoping for a phlebotomy today. He does acknowledge his essential hypertension which is not well controlled.  He reports that he has a visit scheduled with his PCP very soon to follow up and get referral to cardiology and to an eye specialist as he reports that he is overdue. No chest pains, SOB, visual changes. Has chronic mild peripheral edema which he thinks is secondary to his BP medications.   ECOG PS- 1 Pain scale- 0   Review of systems- Review of Systems  Constitutional:  Positive for malaise/fatigue. Negative for chills, fever and weight loss.  HENT:  Negative for congestion, ear discharge and nosebleeds.   Eyes:  Negative for blurred vision.  Respiratory:  Negative for cough, hemoptysis, sputum production, shortness of breath and wheezing.   Cardiovascular:  Negative for chest pain, palpitations, orthopnea and claudication.       Hypertension  Gastrointestinal:  Negative for abdominal pain, blood in stool, constipation, diarrhea, heartburn, melena, nausea and vomiting.  Genitourinary:  Negative for dysuria, flank pain, frequency, hematuria and urgency.  Musculoskeletal:  Positive for joint pain. Negative for back pain and myalgias.  Skin:  Negative for rash.  Neurological:  Positive for headaches. Negative for dizziness, tingling, focal weakness, seizures and weakness.  Endo/Heme/Allergies:  Does not bruise/bleed easily.  Psychiatric/Behavioral:  Negative for depression and suicidal ideas. The patient does not have insomnia.       No Known Allergies   Past Medical History:  Diagnosis Date   Cancer ([place])    Ovarian cancer ([place])    Polycythemia, secondary      History reviewed. No pertinent surgical history.  Social History   Socioeconomic History   Marital status: Married    Spouse name: Not on file   Number of children: Not on file   Years of education:  Not on file   Highest education level: Not on file  Occupational History   Not on file  Tobacco Use   Smoking status: Former    Packs/day: 1.50    Years: 29.00    Total pack years: 43.50    Types: Cigarettes     Quit date: 09/06/2008    Years since quitting: 13.6   Smokeless tobacco: Never  Vaping Use   Vaping Use: Never used  Substance and Sexual Activity   Alcohol use: Not Currently   Drug use: No   Sexual activity: Not on file  Other Topics Concern   Not on file  Social History Narrative   Not on file   Social Determinants of Health   Financial Resource Strain: Not on file  Food Insecurity: Not on file  Transportation Needs: Not on file  Physical Activity: Not on file  Stress: Not on file  Social Connections: Not on file  Intimate Partner Violence: Not on file    Family History  Problem Relation Age of Onset   Cancer Father    Cancer Sister      Current Outpatient Medications:    hydrochlorothiazide (HYDRODIURIL) 12.5 MG tablet, Take 12.5 mg by mouth daily., Disp: , Rfl:    losartan (COZAAR) 100 MG tablet, Take 100 mg by mouth daily., Disp: , Rfl:    probenecid (BENEMID) 500 MG tablet, Take 500 mg by mouth 2 (two) times daily., Disp: , Rfl:    TESTOSTERONE CYPIONATE IJ, Inject 1 Dose as directed 2 (two) times a week., Disp: , Rfl:    atorvastatin (LIPITOR) 20 MG tablet, Take 20 mg by mouth daily. (Patient not taking: Reported on 10/01/2021), Disp: , Rfl:    fluticasone (FLONASE) 50 MCG/ACT nasal spray, PLACE 1 SPRAY INTO BOTH NOSTRILS 2 (TWO) TIMES DAILY (Patient not taking: Reported on 10/01/2021), Disp: , Rfl:    losartan (COZAAR) 50 MG tablet, Take 50 mg by mouth daily. (Patient not taking: Reported on 10/01/2021), Disp: , Rfl:   Physical exam:  Vitals:   04/26/22 1406  BP: (!) 148/102  Pulse: (!) 104  Resp: 18  Temp: 98.5 F (36.9 C)  TempSrc: Tympanic  SpO2: 98%  Weight: (!) 301 lb (136.5 kg)  Height: 6' (1.829 m)   Physical Exam Constitutional:      General: He is not in acute distress. Neck:     Vascular: No carotid bruit.  Cardiovascular:     Rate and Rhythm: Normal rate and regular rhythm.     Heart sounds: Normal heart sounds.  Pulmonary:     Effort:  Pulmonary effort is normal.     Breath sounds: Normal breath sounds.  Abdominal:     General: Bowel sounds are normal.     Palpations: Abdomen is soft.  Skin:    General: Skin is warm and dry.     Comments: Ruddy complexion  Neurological:     Mental Status: He is alert and oriented to person, place, and time.          No data to display            Latest Ref Rng & Units 04/26/2022    1:55 PM  CBC  Hemoglobin 13.0 - 17.0 g/dL 15.9   Hematocrit 39.0 - 52.0 % 49.9      Assessment and plan- Patient is a 56 y.o. male with secondary polycythemia from testosterone replacement therapy here for possible phlebotomy  Reviewed that goal hematocrit < 50. His HCT is  49.9 today and symptomatic. Discussed risks/benefits and he strongly prefers to have phlebotomy today. Proceed with phlebotomy today of 500 cc. We did have a long discussion that his headaches may be related to his HTN and that I strongly recommend closer follow up with PCP, cardiology referral as he has planned and an updated eye visit. Low salt diet advised.   Phlebotomy today 8 weeks- lab (H&H) +/- phlebotomy 16 weeks- lab (cbc), Janese Banks, +/- phlebotomy- [place]   Visit Diagnosis 1. Polycythemia, secondary   2. Long-term current use of testosterone replacement therapy   3. Essential hypertension    Nelwyn Salisbury [facility] at [facility] [phone] (clinic) 04/26/2022

## 2022-04-26 NOTE — Patient Instructions (Signed)

## 2022-04-27 ENCOUNTER — Inpatient Hospital Stay: Payer: Self-pay | Admitting: Medical Oncology

## 2022-04-27 ENCOUNTER — Inpatient Hospital Stay: Payer: Self-pay

## 2022-06-16 DIAGNOSIS — R768 Other specified abnormal immunological findings in serum: Secondary | ICD-10-CM | POA: Insufficient documentation

## 2022-06-16 DIAGNOSIS — M2550 Pain in unspecified joint: Secondary | ICD-10-CM | POA: Insufficient documentation

## 2022-06-20 ENCOUNTER — Other Ambulatory Visit: Payer: Self-pay | Admitting: *Deleted

## 2022-06-20 DIAGNOSIS — D751 Secondary polycythemia: Secondary | ICD-10-CM

## 2022-06-21 ENCOUNTER — Inpatient Hospital Stay: Payer: Self-pay | Attending: Oncology

## 2022-06-21 ENCOUNTER — Inpatient Hospital Stay: Payer: Self-pay

## 2022-08-03 ENCOUNTER — Inpatient Hospital Stay: Payer: Self-pay

## 2022-08-03 ENCOUNTER — Inpatient Hospital Stay: Payer: Self-pay | Admitting: Nurse Practitioner

## 2022-08-06 ENCOUNTER — Encounter: Payer: Self-pay | Admitting: Nurse Practitioner

## 2022-08-06 ENCOUNTER — Inpatient Hospital Stay: Payer: Self-pay | Attending: Oncology

## 2022-08-06 ENCOUNTER — Inpatient Hospital Stay: Payer: Self-pay

## 2022-08-06 ENCOUNTER — Inpatient Hospital Stay (HOSPITAL_BASED_OUTPATIENT_CLINIC_OR_DEPARTMENT_OTHER): Payer: Self-pay | Admitting: Nurse Practitioner

## 2022-08-06 VITALS — BP 143/83 | HR 102 | Temp 98.0°F | Wt 291.0 lb

## 2022-08-06 DIAGNOSIS — E785 Hyperlipidemia, unspecified: Secondary | ICD-10-CM | POA: Insufficient documentation

## 2022-08-06 DIAGNOSIS — D751 Secondary polycythemia: Secondary | ICD-10-CM

## 2022-08-06 DIAGNOSIS — I10 Essential (primary) hypertension: Secondary | ICD-10-CM | POA: Insufficient documentation

## 2022-08-06 DIAGNOSIS — Z87891 Personal history of nicotine dependence: Secondary | ICD-10-CM | POA: Insufficient documentation

## 2022-08-06 DIAGNOSIS — E291 Testicular hypofunction: Secondary | ICD-10-CM | POA: Insufficient documentation

## 2022-08-06 DIAGNOSIS — Z79899 Other long term (current) drug therapy: Secondary | ICD-10-CM | POA: Insufficient documentation

## 2022-08-06 LAB — CBC
HCT: 50.7 % (ref 39.0–52.0)
Hemoglobin: 15.6 g/dL (ref 13.0–17.0)
MCH: 23.4 pg — ABNORMAL LOW (ref 26.0–34.0)
MCHC: 30.8 g/dL (ref 30.0–36.0)
MCV: 76.1 fL — ABNORMAL LOW (ref 80.0–100.0)
Platelets: 277 10*3/uL (ref 150–400)
RBC: 6.66 MIL/uL — ABNORMAL HIGH (ref 4.22–5.81)
RDW: 18.2 % — ABNORMAL HIGH (ref 11.5–15.5)
WBC: 5.1 10*3/uL (ref 4.0–10.5)
nRBC: 0 % (ref 0.0–0.2)

## 2022-08-06 NOTE — Patient Instructions (Signed)

## 2022-08-06 NOTE — Progress Notes (Signed)
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 CBC done on 06/23/2017 showed white count of 7.4, H&H of 18.5/52.8 and platelet count 257. One month ago his H&H was 19.6/56.3. His past medical history is significant for hypertension and hyperlipidemia and hypogonadism. He is on testosterone on replacement therapy through pcp in [place]. His hemoglobin has been between 17-18 units back in 2015. Serum free testosterone level on 06/07/2017 was low at 0.89 and total testosterone was low at 37.    Results of blood work from 06/27/2017 were as follows: CBC showed a normal white count and the platelet count and H&H was elevated at 19.4/56.3 respectively.  EPO levels were normal at 5.6.  Jak 2 mutation testing was negative.  Urinalysis did not reveal any evidence of hematuria.  Chest x-ray showed no active cardiopulmonary disease     Interval history- [name] diagnosed with secondary polycythemia who returns to clinic for follow up and consideration of therapeutic phlebotomy. He continues testosterone. Dose has decreased slightly. He has noted ruddy complexion but otherwise feels well. No headaches. Joint aches and pains are stable and unchanged. Denies other complaints.   ECOG PS- 1 Pain scale- 0  Review of systems- Review of Systems  Constitutional:  Negative for chills,  diaphoresis, fever, malaise/fatigue and weight loss.  HENT:  Negative for congestion, ear discharge, ear pain, nosebleeds, sinus pain and sore throat.   Eyes:  Negative for pain and redness.  Respiratory:  Negative for cough, hemoptysis, sputum production, shortness of breath, wheezing and stridor.   Cardiovascular:  Negative for chest pain, palpitations and leg swelling.  Gastrointestinal:  Negative for abdominal pain, constipation, diarrhea, nausea and vomiting.  Musculoskeletal:  Negative for falls, joint pain and myalgias.  Skin:  Negative for rash.  Neurological:  Negative for dizziness, loss of consciousness, weakness and headaches.  Psychiatric/Behavioral:  Negative for depression. The patient is not nervous/anxious and does not have insomnia.   All other systems reviewed and are negative.   No Known Allergies  Past Medical History:  Diagnosis Date   Cancer ([place])    Ovarian cancer ([place])    Polycythemia, secondary    No past surgical history on file.  Social History   Socioeconomic History   Marital status: Married    Spouse name: Not on file   Number of children: Not on file   Years of education: Not on file   Highest education level: Not on file  Occupational History   Not on file  Tobacco Use   Smoking status: Former    Packs/day: 1.50    Years: 29.00    Total pack years: 43.50    Types: Cigarettes    Quit date: 09/06/2008    Years since quitting: 13.9   Smokeless tobacco: Never  Vaping Use   Vaping Use: Never used  Substance and Sexual Activity   Alcohol use: Not Currently   Drug use: No   Sexual activity: Not on  file  Other Topics Concern   Not on file  Social History Narrative   Not on file   Social Determinants of Health   Financial Resource Strain: Not on file  Food Insecurity: Not on file  Transportation Needs: Not on file  Physical Activity: Not on file  Stress: Not on file  Social Connections: Not on file  Intimate Partner Violence: Not on  file    Family History  Problem Relation Age of Onset   Cancer Father    Cancer Sister      Current Outpatient Medications:    hydrochlorothiazide (HYDRODIURIL) 12.5 MG tablet, Take 12.5 mg by mouth daily., Disp: , Rfl:    losartan (COZAAR) 100 MG tablet, Take 100 mg by mouth daily., Disp: , Rfl:    probenecid (BENEMID) 500 MG tablet, Take 500 mg by mouth 2 (two) times daily., Disp: , Rfl:    TESTOSTERONE CYPIONATE IJ, Inject 1 Dose as directed 2 (two) times a week., Disp: , Rfl:    atorvastatin (LIPITOR) 20 MG tablet, Take 20 mg by mouth daily. (Patient not taking: Reported on 10/01/2021), Disp: , Rfl:    fluticasone (FLONASE) 50 MCG/ACT nasal spray, PLACE 1 SPRAY INTO BOTH NOSTRILS 2 (TWO) TIMES DAILY (Patient not taking: Reported on 10/01/2021), Disp: , Rfl:    losartan (COZAAR) 50 MG tablet, Take 50 mg by mouth daily. (Patient not taking: Reported on 10/01/2021), Disp: , Rfl:   Physical exam:  Vitals:   08/06/22 1357  BP: (!) 143/83  Pulse: (!) 102  Temp: 98 F (36.7 C)  TempSrc: Tympanic  Weight: 291 lb (132 kg)   Physical Exam Constitutional:      Appearance: He is not ill-appearing.  Cardiovascular:     Rate and Rhythm: Normal rate and regular rhythm.  Pulmonary:     Effort: Pulmonary effort is normal.     Breath sounds: Normal breath sounds.  Skin:    General: Skin is warm and dry.     Comments: Ruddy complexion  Neurological:     Mental Status: He is alert and oriented to person, place, and time.  Psychiatric:        Mood and Affect: Mood normal.        Behavior: Behavior normal.        Latest Ref Rng & Units 08/06/2022    1:33 PM  CBC  WBC 4.0 - 10.5 K/uL 5.1   Hemoglobin 13.0 - 17.0 g/dL 15.6   Hematocrit 39.0 - 52.0 % 50.7   Platelets 150 - 400 K/uL 277      Assessment and plan- Patient is a 56 y.o. male with  Secondary polycythemia- d/t testosterone use. Hematocrit goal < 50. Hct today 50.7. Mildly symptomatic. Proceed with phlebotomy today.   Hypertension- elevated BP in clinic. Encouraged him to monitor at home and follow up with pcp.   Disposition: Phlebotomy today 8 weeks- lab (H&H) +/- phlebotomy 16 weeks- lab (cbc), Janese Banks, +/- phlebotomy- la   Visit Diagnosis 1. Polycythemia, secondary    Beckey Rutter, [place], AGNP-C [place] at [facility] [phone] (clinic) 08/06/2022

## 2022-08-16 ENCOUNTER — Other Ambulatory Visit: Payer: Self-pay

## 2022-08-16 ENCOUNTER — Ambulatory Visit: Payer: Self-pay | Admitting: Oncology

## 2022-10-01 ENCOUNTER — Inpatient Hospital Stay: Payer: Self-pay

## 2022-10-01 ENCOUNTER — Inpatient Hospital Stay: Payer: Self-pay | Attending: Oncology

## 2022-10-06 ENCOUNTER — Other Ambulatory Visit: Payer: Self-pay

## 2022-10-06 DIAGNOSIS — I10 Essential (primary) hypertension: Secondary | ICD-10-CM

## 2022-10-06 DIAGNOSIS — D751 Secondary polycythemia: Secondary | ICD-10-CM

## 2022-10-07 ENCOUNTER — Inpatient Hospital Stay: Payer: Self-pay

## 2022-10-07 ENCOUNTER — Inpatient Hospital Stay: Payer: Self-pay | Attending: Oncology

## 2022-10-07 ENCOUNTER — Telehealth: Payer: Self-pay

## 2022-10-07 VITALS — BP 153/99 | HR 97 | Resp 20

## 2022-10-07 DIAGNOSIS — D751 Secondary polycythemia: Secondary | ICD-10-CM

## 2022-10-07 LAB — HEMOGLOBIN AND HEMATOCRIT, BLOOD
HCT: 51 % (ref 39.0–52.0)
Hemoglobin: 16.3 g/dL (ref 13.0–17.0)

## 2022-10-07 NOTE — Progress Notes (Signed)
Requesting Dr Janese Banks to call him to discuss why his "numbers are going up and not down and talk about a chemo pill"

## 2022-10-07 NOTE — Patient Instructions (Signed)

## 2022-10-07 NOTE — Telephone Encounter (Signed)
Spoke to patient by phone re: concerns. He is worried that his polycythemia may be related to an abnormality of the bone marrow and questions the need for a chemotherapy pill. He says that despite reducing or holding testosterone he continues to require phlebotomy. We reviewed primary vs secondary polycythemia including likely sleep apnea. He has declined sleep study but reports that he has been told that he symptomatically has sleep apnea. Reviewed that in obstructive sleep apnea, intermittent hypoxia occurs which causing body to incrase EPO and Rbc production. We could do sleep study to confirm but he declines. Prefers to continue phlebotomy for now. We discussed that if numbers were to worsen or he develops other cytopenias we would consider additional workup.

## 2022-11-22 ENCOUNTER — Other Ambulatory Visit: Payer: Self-pay | Admitting: *Deleted

## 2022-11-22 DIAGNOSIS — D751 Secondary polycythemia: Secondary | ICD-10-CM

## 2022-11-23 ENCOUNTER — Encounter: Payer: Self-pay | Admitting: Oncology

## 2022-11-23 ENCOUNTER — Inpatient Hospital Stay: Payer: Self-pay

## 2022-11-23 ENCOUNTER — Inpatient Hospital Stay: Payer: Self-pay | Attending: Oncology

## 2022-11-23 ENCOUNTER — Inpatient Hospital Stay: Payer: Self-pay | Admitting: Oncology

## 2022-11-26 ENCOUNTER — Ambulatory Visit: Payer: Self-pay | Admitting: Nurse Practitioner

## 2022-11-26 ENCOUNTER — Other Ambulatory Visit: Payer: Self-pay

## 2023-01-10 ENCOUNTER — Other Ambulatory Visit: Payer: Self-pay

## 2023-01-10 ENCOUNTER — Ambulatory Visit: Payer: Self-pay | Admitting: Oncology

## 2023-01-10 ENCOUNTER — Encounter: Payer: Self-pay | Admitting: Oncology

## 2023-01-10 ENCOUNTER — Inpatient Hospital Stay: Payer: Self-pay

## 2023-01-10 ENCOUNTER — Inpatient Hospital Stay: Payer: Self-pay | Admitting: Oncology

## 2023-01-12 ENCOUNTER — Inpatient Hospital Stay: Payer: Self-pay

## 2023-01-12 ENCOUNTER — Encounter: Payer: Self-pay | Admitting: Oncology

## 2023-01-12 ENCOUNTER — Inpatient Hospital Stay (HOSPITAL_BASED_OUTPATIENT_CLINIC_OR_DEPARTMENT_OTHER): Payer: Self-pay | Admitting: Oncology

## 2023-01-12 ENCOUNTER — Other Ambulatory Visit: Payer: Self-pay | Admitting: *Deleted

## 2023-01-12 ENCOUNTER — Inpatient Hospital Stay: Payer: Self-pay | Attending: Oncology

## 2023-01-12 VITALS — BP 121/85 | HR 80

## 2023-01-12 VITALS — BP 135/90 | HR 92 | Temp 97.2°F | Resp 18 | Ht 72.0 in | Wt 268.5 lb

## 2023-01-12 DIAGNOSIS — D751 Secondary polycythemia: Secondary | ICD-10-CM

## 2023-01-12 DIAGNOSIS — Z87891 Personal history of nicotine dependence: Secondary | ICD-10-CM | POA: Insufficient documentation

## 2023-01-12 DIAGNOSIS — Z809 Family history of malignant neoplasm, unspecified: Secondary | ICD-10-CM | POA: Insufficient documentation

## 2023-01-12 DIAGNOSIS — R5383 Other fatigue: Secondary | ICD-10-CM | POA: Insufficient documentation

## 2023-01-12 DIAGNOSIS — Z79899 Other long term (current) drug therapy: Secondary | ICD-10-CM | POA: Insufficient documentation

## 2023-01-12 LAB — CBC WITH DIFFERENTIAL (CANCER CENTER ONLY)
Abs Immature Granulocytes: 0.03 10*3/uL (ref 0.00–0.07)
Basophils Absolute: 0.1 10*3/uL (ref 0.0–0.1)
Basophils Relative: 1 %
Eosinophils Absolute: 0.3 10*3/uL (ref 0.0–0.5)
Eosinophils Relative: 6 %
HCT: 51.2 % (ref 39.0–52.0)
Hemoglobin: 15.7 g/dL (ref 13.0–17.0)
Immature Granulocytes: 1 %
Lymphocytes Relative: 24 %
Lymphs Abs: 1.2 10*3/uL (ref 0.7–4.0)
MCH: 23.4 pg — ABNORMAL LOW (ref 26.0–34.0)
MCHC: 30.7 g/dL (ref 30.0–36.0)
MCV: 76.3 fL — ABNORMAL LOW (ref 80.0–100.0)
Monocytes Absolute: 0.5 10*3/uL (ref 0.1–1.0)
Monocytes Relative: 9 %
Neutro Abs: 2.9 10*3/uL (ref 1.7–7.7)
Neutrophils Relative %: 59 %
Platelet Count: 201 10*3/uL (ref 150–400)
RBC: 6.71 MIL/uL — ABNORMAL HIGH (ref 4.22–5.81)
RDW: 16.9 % — ABNORMAL HIGH (ref 11.5–15.5)
WBC Count: 5 10*3/uL (ref 4.0–10.5)
nRBC: 0 % (ref 0.0–0.2)

## 2023-01-12 NOTE — Patient Instructions (Signed)

## 2023-01-12 NOTE — Progress Notes (Signed)
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 CBC done on 06/23/2017 showed white count of 7.4, H&H of 18.5/52.8 and platelet count 257. One month ago his H&H was 19.6/56.3. His past medical history is significant for hypertension and hyperlipidemia and hypogonadism. He is on testosterone on replacement therapy through pcp in [place]. His hemoglobin has been between 17-18 units back in 2015. Serum free testosterone level on 06/07/2017 was low at0.89 and total testosterone was low at 37.    Results of blood work from 06/27/2017 were as follows: CBC showed a normal white count and the platelet count and H&H was elevated at 19.4/56.3 respectively.  EPO levels were normal at 5.6.  Jak 2 mutation testing was negative.  Urinalysis did not reveal any evidence of hematuria.  Chest x-ray showed no active cardiopulmonary disease    Interval history-reports that his head feels heavy when his hematocrit goes up.  Reports chronic fatigue.  He is on low-dose testosterone replacement therapy   ECOG PS- 1 Pain scale- 0   Review of systems- Review of Systems  Constitutional:  Positive for malaise/fatigue.      Allergies  Allergen Reactions   Amlodipine Other (See Comments)    edema     Past Medical History:  Diagnosis Date   Cancer ([facility])    Ovarian cancer ([facility])    Polycythemia, secondary      History reviewed. No  pertinent surgical history.  Social History   Socioeconomic History   Marital status: Married    Spouse name: Not on file   Number of children: Not on file   Years of education: Not on file   Highest education level: Not on file  Occupational History   Not on file  Tobacco Use   Smoking status: Former    Packs/day: 1.50    Years: 29.00    Additional pack years: 0.00    Total pack years: 43.50    Types: Cigarettes    Quit date: 09/06/2008    Years since quitting: 14.3   Smokeless tobacco: Never  Vaping Use   Vaping Use: Never used  Substance and Sexual Activity   Alcohol use: Not Currently   Drug use: No   Sexual activity: Not on file  Other Topics Concern   Not on file  Social History Narrative   Not on file   Social Determinants of Health   Financial Resource Strain: Not on file  Food Insecurity: Not on file  Transportation Needs: Not on file  Physical Activity: Not on file  Stress: Not on file  Social Connections: Not on file  Intimate Partner Violence: Not on file    Family History  Problem Relation Age of Onset   Cancer Father    Cancer Sister      Current Outpatient Medications:    atorvastatin (LIPITOR) 20 MG tablet, Take 20 mg by mouth daily., Disp: , Rfl:    fluticasone (FLONASE) 50 MCG/ACT nasal spray, PLACE 1 SPRAY INTO BOTH NOSTRILS 2 (  TWO) TIMES DAILY, Disp: , Rfl:    hydrochlorothiazide (HYDRODIURIL) 12.5 MG tablet, Take 12.5 mg by mouth daily., Disp: , Rfl:    losartan (COZAAR) 100 MG tablet, Take 100 mg by mouth daily., Disp: , Rfl:    losartan (COZAAR) 50 MG tablet, Take 50 mg by mouth daily., Disp: , Rfl:    probenecid (BENEMID) 500 MG tablet, Take 500 mg by mouth 2 (two) times daily., Disp: , Rfl:    terbinafine (LAMISIL) 250 MG tablet, Take 250 mg by mouth daily., Disp: , Rfl:    TESTOSTERONE CYPIONATE IJ, Inject 1 Dose as directed 2 (two) times a week., Disp: , Rfl:   Physical exam:  Vitals:   01/12/23 1017  BP: (!) 135/90  Pulse: 92   Resp: 18  Temp: (!) 97.2 F (36.2 C)  TempSrc: Tympanic  SpO2: 99%  Weight: 268 lb 8 oz (121.8 kg)  Height: 6' (1.829 m)   Physical Exam Constitutional:      Comments: Facial plethora  Cardiovascular:     Rate and Rhythm: Normal rate and regular rhythm.     Heart sounds: Normal heart sounds.  Pulmonary:     Effort: Pulmonary effort is normal.     Breath sounds: Normal breath sounds.  Abdominal:     General: Bowel sounds are normal.     Palpations: Abdomen is soft.  Skin:    General: Skin is warm and dry.  Neurological:     Mental Status: He is alert and oriented to person, place, and time.          No data to display            Latest Ref Rng & Units 01/12/2023   10:02 AM  CBC  WBC 4.0 - 10.5 K/uL 5.0   Hemoglobin 13.0 - 17.0 g/dL 16.1   Hematocrit 09.6 - 52.0 % 51.2   Platelets 150 - 400 K/uL 201      Assessment and plan- Patient is a 57 y.o. male with secondary polycythemia from testosterone replacement therapy here for possible phlebotomy  Hematocrit is 51.2 today and he will proceed with phlebotomy today.  Goal hematocrit is less than 50 while on testosterone.  We will continue with weekly H&H and phlebotomy for the next 2 weeks if hematocrit is greater than 50.  Repeat CBC for possible phlebotomy in 2 and 4 months and I will see him back in 4 months   Visit Diagnosis 1. Polycythemia, secondary      Dr. Owens Shark, MD, MPH [facility] at [facility] [phone] 01/12/2023 1:04 PM

## 2023-01-19 ENCOUNTER — Inpatient Hospital Stay: Payer: Self-pay

## 2023-01-26 ENCOUNTER — Inpatient Hospital Stay: Payer: Self-pay

## 2023-01-26 DIAGNOSIS — D751 Secondary polycythemia: Secondary | ICD-10-CM

## 2023-01-26 LAB — HEMOGLOBIN AND HEMATOCRIT, BLOOD
HCT: 52.6 % — ABNORMAL HIGH (ref 39.0–52.0)
Hemoglobin: 16.1 g/dL (ref 13.0–17.0)

## 2023-02-04 ENCOUNTER — Inpatient Hospital Stay: Payer: Self-pay

## 2023-02-11 ENCOUNTER — Inpatient Hospital Stay: Payer: Self-pay | Attending: Oncology

## 2023-02-11 ENCOUNTER — Inpatient Hospital Stay: Payer: Self-pay

## 2023-02-11 DIAGNOSIS — D751 Secondary polycythemia: Secondary | ICD-10-CM | POA: Insufficient documentation

## 2023-02-11 LAB — CBC
HCT: 46.1 % (ref 39.0–52.0)
Hemoglobin: 14.2 g/dL (ref 13.0–17.0)
MCH: 22.6 pg — ABNORMAL LOW (ref 26.0–34.0)
MCHC: 30.8 g/dL (ref 30.0–36.0)
MCV: 73.5 fL — ABNORMAL LOW (ref 80.0–100.0)
Platelets: 248 10*3/uL (ref 150–400)
RBC: 6.27 MIL/uL — ABNORMAL HIGH (ref 4.22–5.81)
RDW: 14.7 % (ref 11.5–15.5)
WBC: 5.3 10*3/uL (ref 4.0–10.5)
nRBC: 0 % (ref 0.0–0.2)

## 2023-02-11 LAB — IRON AND TIBC
Iron: 23 ug/dL — ABNORMAL LOW (ref 45–182)
Saturation Ratios: 6 % — ABNORMAL LOW (ref 17.9–39.5)
TIBC: 412 ug/dL (ref 250–450)
UIBC: 389 ug/dL

## 2023-02-11 LAB — FERRITIN: Ferritin: 6 ng/mL — ABNORMAL LOW (ref 24–336)

## 2023-02-11 NOTE — Progress Notes (Signed)
Hct 46.1. No phlebotomy needed today

## 2023-03-15 ENCOUNTER — Inpatient Hospital Stay: Payer: Self-pay

## 2023-03-15 ENCOUNTER — Inpatient Hospital Stay: Payer: Self-pay | Attending: Oncology

## 2023-05-17 ENCOUNTER — Other Ambulatory Visit: Payer: Self-pay

## 2023-05-17 ENCOUNTER — Inpatient Hospital Stay: Payer: Self-pay

## 2023-05-17 ENCOUNTER — Inpatient Hospital Stay: Payer: Self-pay | Attending: Oncology

## 2023-06-03 ENCOUNTER — Telehealth: Payer: Self-pay | Admitting: Oncology

## 2023-06-03 ENCOUNTER — Other Ambulatory Visit: Payer: Self-pay | Admitting: *Deleted

## 2023-06-03 DIAGNOSIS — D751 Secondary polycythemia: Secondary | ICD-10-CM

## 2023-06-03 NOTE — Telephone Encounter (Signed)
Pt wife called and stated she thinks her husband needs a phlebotomy. Please advise on scheduling

## 2023-06-03 NOTE — Telephone Encounter (Signed)
He can come next week for cbc and based on that we will decide what to do. You an schedule him same day for possible phlebotomy

## 2023-06-06 ENCOUNTER — Inpatient Hospital Stay: Payer: Self-pay

## 2023-06-15 ENCOUNTER — Telehealth: Payer: Self-pay | Admitting: Oncology

## 2023-06-15 NOTE — Telephone Encounter (Signed)
He can come this week for h/H and possible phlebotomy

## 2023-06-15 NOTE — Telephone Encounter (Signed)
Pt wife called and stated that the patient has a "pounding" headache,  his face is red and is tired. She thinks he needs a phleb. Please advise on scheduling

## 2023-06-17 ENCOUNTER — Inpatient Hospital Stay: Payer: Self-pay | Attending: Oncology

## 2023-06-17 ENCOUNTER — Inpatient Hospital Stay: Payer: Self-pay

## 2023-06-17 VITALS — BP 115/86 | HR 105 | Temp 96.2°F

## 2023-06-17 DIAGNOSIS — D751 Secondary polycythemia: Secondary | ICD-10-CM

## 2023-06-17 LAB — CBC
HCT: 50.2 % (ref 39.0–52.0)
Hemoglobin: 16.4 g/dL (ref 13.0–17.0)
MCH: 25.5 pg — ABNORMAL LOW (ref 26.0–34.0)
MCHC: 32.7 g/dL (ref 30.0–36.0)
MCV: 78.2 fL — ABNORMAL LOW (ref 80.0–100.0)
Platelets: 250 10*3/uL (ref 150–400)
RBC: 6.42 MIL/uL — ABNORMAL HIGH (ref 4.22–5.81)
RDW: 17.9 % — ABNORMAL HIGH (ref 11.5–15.5)
WBC: 7 10*3/uL (ref 4.0–10.5)
nRBC: 0 % (ref 0.0–0.2)

## 2023-06-17 NOTE — Patient Instructions (Signed)

## 2023-07-13 ENCOUNTER — Inpatient Hospital Stay: Payer: Self-pay

## 2023-07-13 ENCOUNTER — Inpatient Hospital Stay: Payer: Self-pay | Attending: Oncology

## 2023-07-13 ENCOUNTER — Inpatient Hospital Stay: Payer: Self-pay | Admitting: Oncology

## 2023-07-15 ENCOUNTER — Ambulatory Visit: Payer: Self-pay | Admitting: Oncology

## 2023-07-15 ENCOUNTER — Other Ambulatory Visit: Payer: Self-pay

## 2023-07-19 ENCOUNTER — Other Ambulatory Visit: Payer: Self-pay | Admitting: *Deleted

## 2023-07-19 DIAGNOSIS — Z79890 Hormone replacement therapy (postmenopausal): Secondary | ICD-10-CM

## 2023-07-19 DIAGNOSIS — D751 Secondary polycythemia: Secondary | ICD-10-CM

## 2023-08-16 ENCOUNTER — Ambulatory Visit
Admission: RE | Admit: 2023-08-16 | Discharge: 2023-08-16 | Disposition: A | Discharge status: Home / Self Care | Payer: Self-pay | Source: Ambulatory Visit | Attending: Emergency Medicine | Admitting: Emergency Medicine

## 2023-08-16 VITALS — BP 161/101 | HR 90 | Temp 98.7°F | Resp 19

## 2023-08-16 DIAGNOSIS — L84 Corns and callosities: Secondary | ICD-10-CM

## 2023-08-16 DIAGNOSIS — M25512 Pain in left shoulder: Secondary | ICD-10-CM

## 2023-08-16 DIAGNOSIS — R21 Rash and other nonspecific skin eruption: Secondary | ICD-10-CM

## 2023-08-16 DIAGNOSIS — M79671 Pain in right foot: Secondary | ICD-10-CM

## 2023-08-16 DIAGNOSIS — S4992XA Unspecified injury of left shoulder and upper arm, initial encounter: Secondary | ICD-10-CM

## 2023-08-16 MED ORDER — TRIAMCINOLONE ACETONIDE 0.5 % EX OINT: 1.0000 | TOPICAL_OINTMENT | Freq: Two times a day (BID) | CUTANEOUS | 0 refills | Status: AC

## 2023-08-16 NOTE — ED Triage Notes (Addendum)
Patient states that he has a sore spot on his right pinky toe that's been there for about 2 months.   Patient was ran over by his tractor on halloween and has left shoulder pain. Didn't get seen.

## 2023-08-16 NOTE — ED Provider Notes (Signed)
[facility]    CSN: [id number] Arrival date & time: 08/16/23  1812      History   Chief Complaint Chief Complaint  Patient presents with   Foot Pain    Entered by patient   Shoulder Pain    HPI Jerry Espinoza is a 57 y.o. male.   57 year old male patient, Jerry Espinoza, presents to urgent care for evaluation of sore spot on right pinky toe that has been there for 2 months, patient also states he was run over by his tractor on Halloween and has had left shoulder pain,decreased ROM since, no previous evaluation, requesting Orthopedic referral.  Patient also complaining of itchy red area to right lower leg for months intermittently, and has been using over-the-counter cream without relief.   PMH: HTN, cancer, polycythemia, former smoker  The history is provided by the patient. No language interpreter was used.    Past Medical History:  Diagnosis Date   Cancer [facility])    Polycythemia, secondary     Patient Active Problem List   Diagnosis Date Noted   Injury of left shoulder 08/16/2023   Acute pain of left shoulder 08/16/2023   Callus of toe 08/16/2023   Right foot pain 08/16/2023   Rash and nonspecific skin eruption 08/16/2023   Polyarthralgia 06/16/2022   Rheumatoid factor positive 06/16/2022   Former tobacco use 02/27/2021   Hypertriglyceridemia 11/27/2020   Hypogonadism male 06/27/2017   Essential hypertension 06/07/2017   Polycythemia, secondary 06/07/2017   BMI 34.0-34.9,adult 06/07/2017    History reviewed. No pertinent surgical history.     Home Medications    Prior to Admission medications   Medication Sig Start Date End Date Taking? Authorizing Provider  hydrochlorothiazide (HYDRODIURIL) 12.5 MG tablet Take 12.5 mg by mouth daily. 03/13/22  Yes [provider]  losartan (COZAAR) 100 MG tablet Take 100 mg by mouth daily.   Yes [provider]  losartan (COZAAR) 50 MG tablet Take 50 mg by mouth daily. 02/27/21  Yes  [provider]  probenecid (BENEMID) 500 MG tablet Take 500 mg by mouth 2 (two) times daily.   Yes [provider]  TESTOSTERONE CYPIONATE IJ Inject 1 Dose as directed 2 (two) times a week.   Yes [provider]  atorvastatin (LIPITOR) 20 MG tablet Take 20 mg by mouth daily. 04/27/21   [provider]  fluticasone (FLONASE) 50 MCG/ACT nasal spray PLACE 1 SPRAY INTO BOTH NOSTRILS 2 (TWO) TIMES DAILY 09/18/20   [provider]  terbinafine (LAMISIL) 250 MG tablet Take 250 mg by mouth daily.    [provider]    Family History Family History  Problem Relation Age of Onset   Cancer Father    Cancer Sister     Social History Social History   Tobacco Use   Smoking status: Former    Current packs/day: 0.00    Average packs/day: 1.5 packs/day for 29.0 years (43.5 ttl pk-yrs)    Types: Cigarettes    Start date: 09/07/1979    Quit date: 09/06/2008    Years since quitting: 14.9   Smokeless tobacco: Never  Vaping Use   Vaping status: Never Used  Substance Use Topics   Alcohol use: Not Currently   Drug use: No     Allergies   Amlodipine   Review of Systems Review of Systems  Constitutional:  Negative for fever.  Musculoskeletal:  Positive for arthralgias and myalgias. Negative for joint swelling.  Skin:  Positive for color change, rash  and wound.  All other systems reviewed and are negative.    Physical Exam Triage Vital Signs ED Triage Vitals  Encounter Vitals Group     BP 08/16/23 1828 (!) 161/101     Systolic BP Percentile --      Diastolic BP Percentile --      Pulse Rate 08/16/23 1828 90     Resp 08/16/23 1828 19     Temp 08/16/23 1828 98.7 F (37.1 C)     Temp Source 08/16/23 1828 Oral     SpO2 08/16/23 1828 96 %     Weight --      Height --      Head Circumference --      Peak Flow --      Pain Score 08/16/23 1825 2     Pain Loc --      Pain Education --      Exclude from Growth Chart --    No data  found.  Updated Vital Signs BP (!) 161/101 (BP Location: Right Arm)   Pulse 90   Temp 98.7 F (37.1 C) (Oral)   Resp 19   SpO2 96%   Visual Acuity Right Eye Distance:   Left Eye Distance:   Bilateral Distance:    Right Eye Near:   Left Eye Near:    Bilateral Near:     Physical Exam Vitals and nursing note reviewed.  Constitutional:      General: He is not in acute distress.    Appearance: He is well-developed and well-groomed.  HENT:     Head: Normocephalic and atraumatic.  Eyes:     Conjunctiva/sclera: Conjunctivae normal.  Cardiovascular:     Rate and Rhythm: Normal rate and regular rhythm.     Pulses:          Dorsalis pedis pulses are 2+ on the right side.     Heart sounds: No murmur heard. Pulmonary:     Effort: Pulmonary effort is normal. No respiratory distress.     Breath sounds: Normal breath sounds.  Abdominal:     Palpations: Abdomen is soft.     Tenderness: There is no abdominal tenderness.  Musculoskeletal:        General: No swelling.       Arms:     Cervical back: Neck supple.       Feet:     Comments: Pt is able to abduct to 45 degrees left shoulder  Feet:     Right foot:     Skin integrity: Callus present.  Skin:    General: Skin is warm and dry.     Capillary Refill: Capillary refill takes less than 2 seconds.     Findings: Rash present. Rash is macular and papular. Rash is not purpuric, pustular or vesicular.          Comments: Pt has red rash to right lower anterior shin, no purulent drainage,no vesicles or pustules,no streaking  Neurological:     General: No focal deficit present.     Mental Status: He is alert and oriented to person, place, and time.     GCS: GCS eye subscore is 4. GCS verbal subscore is 5. GCS motor subscore is 6.     Cranial Nerves: No cranial nerve deficit.     Sensory: No sensory deficit.     Comments: -ROM to left shoulder d/t pain  Psychiatric:        Mood and Affect: Mood normal.  Behavior: Behavior  is cooperative.      UC Treatments / Results  Labs (all labs ordered are listed, but only abnormal results are displayed) Labs Reviewed - No data to display  EKG   Radiology No results found.  Procedures Procedures (including critical care time)  Medications Ordered in UC Medications - No data to display  Initial Impression / Assessment and Plan / UC Course  I have reviewed the triage vital signs and the nursing notes.  Pertinent labs & imaging results that were available during my care of the patient were reviewed by me and considered in my medical decision making (see chart for details).    Offered left shoulder x-ray,  patient wishes to follow-up with orthopedics and have it all done at that time discussed exam findings and plan of care with patient, will send prescription for triamcinolone for rash, referral to orthopedics/podiatry given.  Patient verbalized understanding this provider  Ddx: Left shoulder pain due to injury, callus of right fifth toe, right foot pain, rash, dermatitis,RSD, medication reaction Final Clinical Impressions(s) / UC Diagnoses   Final diagnoses:  Injury of left shoulder, initial encounter  Acute pain of left shoulder  Callus of toe  Right foot pain  Rash and nonspecific skin eruption     Discharge Instructions      Please follow-up with [facility] for evaluation of your left shoulder injury pain-call for appointment. [facility]: [address] [place], [place] [address] Phone: [phone] Urgent care hours 8a-7:30p Mon-Sat  May try over-the-counter callus pad to right toe, follow-up with podiatry if this does not improve-call for appointment  Follow-up with PCP regarding blood pressure as it is elevated at today's visit.     ED Prescriptions   None    PDMP not reviewed this encounter.   Clancy Gourd, NP 08/16/23 [phone]

## 2023-08-16 NOTE — Discharge Instructions (Signed)
Please follow-up with [facility] for evaluation of your left shoulder injury pain-call for appointment. [facility]: [address] [place], [place] [address] Phone: [phone] Urgent care hours 8a-7:30p Mon-Sat  May try over-the-counter callus pad to right toe, follow-up with podiatry if this does not improve-call for appointment  Follow-up with PCP regarding blood pressure as it is elevated at today's visit.

## 2023-09-06 ENCOUNTER — Encounter: Payer: Self-pay | Admitting: Oncology

## 2023-09-27 ENCOUNTER — Encounter: Payer: Self-pay | Admitting: Oncology

## 2023-11-21 ENCOUNTER — Other Ambulatory Visit: Payer: Self-pay

## 2023-11-21 ENCOUNTER — Telehealth: Payer: Self-pay | Admitting: *Deleted

## 2023-11-21 DIAGNOSIS — D751 Secondary polycythemia: Secondary | ICD-10-CM

## 2023-11-21 NOTE — Telephone Encounter (Signed)
 Cbc this week for possible phlebotomy please

## 2023-11-21 NOTE — Telephone Encounter (Signed)
 Pt spouse called again and appts are scheduled for tomorrow

## 2023-11-22 ENCOUNTER — Inpatient Hospital Stay: Payer: Self-pay

## 2023-11-22 ENCOUNTER — Inpatient Hospital Stay: Payer: Self-pay | Attending: Oncology

## 2023-11-22 DIAGNOSIS — D751 Secondary polycythemia: Secondary | ICD-10-CM | POA: Insufficient documentation

## 2023-11-22 DIAGNOSIS — Z79890 Hormone replacement therapy (postmenopausal): Secondary | ICD-10-CM

## 2023-11-22 LAB — CBC (CANCER CENTER ONLY)
HCT: 46.9 % (ref 39.0–52.0)
Hemoglobin: 15.2 g/dL (ref 13.0–17.0)
MCH: 26.8 pg (ref 26.0–34.0)
MCHC: 32.4 g/dL (ref 30.0–36.0)
MCV: 82.6 fL (ref 80.0–100.0)
Platelet Count: 182 10*3/uL (ref 150–400)
RBC: 5.68 MIL/uL (ref 4.22–5.81)
RDW: 16.9 % — ABNORMAL HIGH (ref 11.5–15.5)
WBC Count: 7.2 10*3/uL (ref 4.0–10.5)
nRBC: 0 % (ref 0.0–0.2)

## 2023-11-22 LAB — FERRITIN: Ferritin: 11 ng/mL — ABNORMAL LOW (ref 24–336)

## 2023-11-22 NOTE — Progress Notes (Signed)
 Today patient's Hct 46.9 goal hct less than 50, patient still requested it, has symptoms headaches and pressure and said last time he felt like this he needed it. MD notifed, ordered still no phlebotomy. Patient aware MD response advised to f/u with PCP regarding elevated BP may be causing symptoms. Discharged. Refused AVS

## 2023-11-29 ENCOUNTER — Other Ambulatory Visit: Payer: Self-pay

## 2023-11-29 DIAGNOSIS — D751 Secondary polycythemia: Secondary | ICD-10-CM

## 2023-11-30 ENCOUNTER — Telehealth: Payer: Self-pay | Admitting: Oncology

## 2023-11-30 ENCOUNTER — Inpatient Hospital Stay: Payer: Self-pay

## 2023-11-30 ENCOUNTER — Inpatient Hospital Stay: Payer: Self-pay | Admitting: Oncology

## 2023-11-30 ENCOUNTER — Encounter: Payer: Self-pay | Admitting: Oncology

## 2023-11-30 NOTE — Telephone Encounter (Signed)
 Attempt made to reach patient in regards to today's missed appointment. No answer and call went straight to VM (also on spouse's line). Message left informing patient that due to excessive no-shows to labs, MD and infusion appointments here at the cancer center he has been dismissed from clinic and will need to seek care elsewhere asap. Number provider to patient resources should he need assistance. If any urgent needs occur in the next 30 days he may reach out to the cancer center.

## 2024-05-18 ENCOUNTER — Ambulatory Visit
Admission: EM | Admit: 2024-05-18 | Discharge: 2024-05-18 | Disposition: A | Discharge status: Home / Self Care | Payer: Self-pay

## 2024-05-18 ENCOUNTER — Encounter: Payer: Self-pay | Admitting: Emergency Medicine

## 2024-05-18 DIAGNOSIS — T1592XA Foreign body on external eye, part unspecified, left eye, initial encounter: Secondary | ICD-10-CM

## 2024-05-18 NOTE — ED Provider Notes (Signed)
 [facility]    CSN: [id number] Arrival date & time: 05/18/24  1316      History   Chief Complaint Chief Complaint  Patient presents with   Foreign Body in Eye    left    HPI Rashaad Hallstrom is a 58 y.o. male presenting for left eye redness and foreign body sensation.  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  He was not wearing protective lenses.  He says he tried to flush the eye with Visine eyedrops but that did not seem to work.  He reports pain but denies any associated swelling, eye drainage, photophobia.  HPI  Past Medical History:  Diagnosis Date   Cancer (HCC)    Polycythemia, secondary     Patient Active Problem List   Diagnosis Date Noted   Injury of left shoulder 08/16/2023   Acute pain of left shoulder 08/16/2023   Callus of toe 08/16/2023   Right foot pain 08/16/2023   Rash and nonspecific skin eruption 08/16/2023   Polyarthralgia 06/16/2022   Rheumatoid factor positive 06/16/2022   Former tobacco use 02/27/2021   Hypertriglyceridemia 11/27/2020   Hypogonadism male 06/27/2017   Essential hypertension 06/07/2017   Polycythemia, secondary 06/07/2017   BMI 34.0-34.9,adult 06/07/2017    History reviewed. No pertinent surgical history.     Home Medications    Prior to Admission medications   Medication Sig Start Date End Date Taking? Authorizing Provider  atorvastatin (LIPITOR) 20 MG tablet Take 20 mg by mouth daily. 04/27/21   [provider]  fluticasone (FLONASE) 50 MCG/ACT nasal spray PLACE 1 SPRAY INTO BOTH NOSTRILS 2 (TWO) TIMES DAILY 09/18/20   [provider]  hydrochlorothiazide (HYDRODIURIL) 12.5 MG tablet Take 12.5 mg by mouth daily. 03/13/22   [provider]  losartan (COZAAR) 100 MG tablet Take 100 mg by mouth daily.    [provider]  losartan (COZAAR) 50 MG tablet Take 50 mg by mouth daily. 02/27/21   [provider]  probenecid  (BENEMID) 500 MG tablet Take 500 mg by mouth 2 (two) times daily.    [provider]  terbinafine (LAMISIL) 250 MG tablet Take 250 mg by mouth daily.    [provider]  TESTOSTERONE  CYPIONATE IJ Inject 1 Dose as directed 2 (two) times a week.    [provider]    Family History Family History  Problem Relation Age of Onset   Cancer Father    Cancer Sister     Social History Social History   Tobacco Use   Smoking status: Former    Current packs/day: 0.00    Average packs/day: 1.5 packs/day for 29.0 years (43.5 ttl pk-yrs)    Types: Cigarettes    Start date: 09/07/1979    Quit date: 09/06/2008    Years since quitting: 15.7   Smokeless tobacco: Never  Vaping Use   Vaping status: Never Used  Substance Use Topics   Alcohol use: Not Currently   Drug use: No     Allergies   Amlodipine   Review of Systems Review of Systems  HENT:  Negative for facial swelling.   Eyes:  Positive for pain and redness. Negative for photophobia, discharge, itching and visual disturbance.  Neurological:  Negative for headaches.     Physical Exam Triage Vital Signs ED Triage Vitals  Encounter Vitals Group     BP 05/18/24 1338 (!) 162/94     Girls Systolic BP Percentile --  Girls Diastolic BP Percentile --      Boys Systolic BP Percentile --      Boys Diastolic BP Percentile --      Pulse Rate 05/18/24 1338 90     Resp 05/18/24 1338 15     Temp 05/18/24 1338 98.7 F (37.1 C)     Temp Source 05/18/24 1338 Oral     SpO2 05/18/24 1338 96 %     Weight 05/18/24 1337 268 lb 8.3 oz (121.8 kg)     Height 05/18/24 1337 6' (1.829 m)     Head Circumference --      Peak Flow --      Pain Score 05/18/24 1337 5     Pain Loc --      Pain Education --      Exclude from Growth Chart --    No data found.  Updated Vital Signs BP (!) 162/94 (BP Location: Right Arm)   Pulse 90   Temp 98.7 F (37.1 C) (Oral)   Resp 15   Ht 6' (1.829 m)   Wt 268 lb 8.3 oz (121.8 kg)    SpO2 96%   BMI 36.42 kg/m   Visual Acuity Right Eye Distance: 20/40 uncorrected Left Eye Distance: 20/50 uncorrected Bilateral Distance: 20/50 unsorrected    Physical Exam Vitals and nursing note reviewed.  Constitutional:      General: He is not in acute distress.    Appearance: Normal appearance. He is well-developed.  HENT:     Head: Normocephalic and atraumatic.  Eyes:     General: No scleral icterus.    Extraocular Movements: Extraocular movements intact.     Conjunctiva/sclera:     Left eye: Left conjunctiva is injected.     Pupils: Pupils are equal, round, and reactive to light.     Comments: Multiple tiny dark specks/foreign bodies of conjunctiva   Cardiovascular:     Rate and Rhythm: Normal rate.  Pulmonary:     Effort: Pulmonary effort is normal. No respiratory distress.  Musculoskeletal:     Cervical back: Neck supple.  Skin:    General: Skin is warm and dry.     Capillary Refill: Capillary refill takes less than 2 seconds.  Neurological:     General: No focal deficit present.     Mental Status: He is alert. Mental status is at baseline.     Motor: No weakness.     Gait: Gait normal.  Psychiatric:        Mood and Affect: Mood normal.        Behavior: Behavior normal.      UC Treatments / Results  Labs (all labs ordered are listed, but only abnormal results are displayed) Labs Reviewed - No data to display  EKG   Radiology No results found.  Procedures Procedures (including critical care time)  Medications Ordered in UC Medications - No data to display  Initial Impression / Assessment and Plan / UC Course  I have reviewed the triage vital signs and the nursing notes.  Pertinent labs & imaging results that were available during my care of the patient were reviewed by me and considered in my medical decision making (see chart for details).   58 year old male presents for foreign body of the left eye that occurred immediately prior to  arrival to urgent care when debris from ceiling tiles caught in his eye.  Unable to remove the foreign body with Visine eyedrops at home.  On evaluation there are  multiple tiny dark specks of the conjunctiva which were removed with saline flushing and sterile cotton tip applicator after instilling tetracaine eyedrops.  Patient did give consent for procedure.  Risks discussed.  Removed foreign bodies.  Encouraged him to continue flushing eye if he still feels foreign body sensation but he is not complaining of any foreign body sensation at this time.  Encouraged him to return for any sign of infection.  Advised to go to an eye doctor if he continues to feel foreign body sensation or has pain or vision changes.   Final Clinical Impressions(s) / UC Diagnoses   Final diagnoses:  Foreign body of left eye, initial encounter   Discharge Instructions   None    ED Prescriptions   None    PDMP not reviewed this encounter.   Arvis Jolan NOVAK, PA-C 05/18/24 1415

## 2024-05-18 NOTE — ED Triage Notes (Signed)
 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  Patient c/o pain and watery in his left eye.

## 2024-05-23 ENCOUNTER — Other Ambulatory Visit: Payer: Self-pay

## 2024-05-23 ENCOUNTER — Emergency Department
Admission: EM | Admit: 2024-05-23 | Discharge: 2024-05-24 | Disposition: A | Discharge status: Home / Self Care | Payer: Self-pay | Attending: Emergency Medicine | Admitting: Emergency Medicine

## 2024-05-23 DIAGNOSIS — L03115 Cellulitis of right lower limb: Secondary | ICD-10-CM | POA: Insufficient documentation

## 2024-05-23 DIAGNOSIS — W540XXA Bitten by dog, initial encounter: Secondary | ICD-10-CM | POA: Insufficient documentation

## 2024-05-23 LAB — CBC WITH DIFFERENTIAL/PLATELET
Abs Immature Granulocytes: 0.03 K/uL (ref 0.00–0.07)
Basophils Absolute: 0 K/uL (ref 0.0–0.1)
Basophils Relative: 0 %
Eosinophils Absolute: 0.3 K/uL (ref 0.0–0.5)
Eosinophils Relative: 4 %
HCT: 43 % (ref 39.0–52.0)
Hemoglobin: 14.8 g/dL (ref 13.0–17.0)
Immature Granulocytes: 0 %
Lymphocytes Relative: 16 %
Lymphs Abs: 1.1 K/uL (ref 0.7–4.0)
MCH: 29.2 pg (ref 26.0–34.0)
MCHC: 34.4 g/dL (ref 30.0–36.0)
MCV: 85 fL (ref 80.0–100.0)
Monocytes Absolute: 0.5 K/uL (ref 0.1–1.0)
Monocytes Relative: 7 %
Neutro Abs: 5.4 K/uL (ref 1.7–7.7)
Neutrophils Relative %: 73 %
Platelets: 269 K/uL (ref 150–400)
RBC: 5.06 MIL/uL (ref 4.22–5.81)
RDW: 13.9 % (ref 11.5–15.5)
WBC: 7.3 K/uL (ref 4.0–10.5)
nRBC: 0 % (ref 0.0–0.2)

## 2024-05-23 NOTE — ED Triage Notes (Signed)
 Pt reports he got bit by a dog on his right foot a few days ago, pt presents with swelling and redness noted to foot.

## 2024-05-24 LAB — BASIC METABOLIC PANEL WITH GFR
Anion gap: 11 (ref 5–15)
BUN: 20 mg/dL (ref 6–20)
CO2: 24 mmol/L (ref 22–32)
Calcium: 8.9 mg/dL (ref 8.9–10.3)
Chloride: 101 mmol/L (ref 98–111)
Creatinine, Ser: 1.11 mg/dL (ref 0.61–1.24)
GFR, Estimated: >60 mL/min (ref >60)
Glucose, Bld: 141 mg/dL — ABNORMAL HIGH (ref 70–99)
Potassium: 4.1 mmol/L (ref 3.5–5.1)
Sodium: 136 mmol/L (ref 135–145)

## 2024-05-24 LAB — LACTIC ACID, PLASMA: Lactic Acid, Venous: 1.6 mmol/L (ref 0.5–1.9)

## 2024-05-24 MED ORDER — AMOXICILLIN-POT CLAVULANATE 875-125 MG PO TABS
1.0000 | ORAL_TABLET | Freq: Two times a day (BID) | ORAL | 0 refills | Status: AC
Start: 2024-05-24 — End: 2024-05-31

## 2024-05-24 MED ORDER — AMOXICILLIN-POT CLAVULANATE 875-125 MG PO TABS
1.0000 | ORAL_TABLET | Freq: Once | ORAL | Status: AC
  Administered 2024-05-24: 1 via ORAL
  Filled 2024-05-24: qty 1

## 2024-05-24 NOTE — ED Notes (Signed)
 Pt given DC instructions. Pt verbalized understanding of medication and follow up care. Pt ambulatory from ED without difficulty. NAD noted at time of departure.

## 2024-05-24 NOTE — ED Provider Notes (Signed)
 [facility] Provider Note    Event Date/Time   First MD Initiated Contact with Patient 05/23/24 2350     (approximate)   History   Animal Bite   HPI  Jerry Espinoza is a 58 y.o. male   Past medical history of polycythemia who presents to the Emergency Department with infection of the right foot after a dog bite sustained a couple days ago.  he knows the owner of the dog and the dog is fully vaccinated.  There is a small abrasion to the top of the right midfoot and over the last couple days has become red and swollen.  Denies systemic symptoms of infection like fevers or chills.  Able to ambulate despite the pain.  No purulence.  Independent Historian contributed to assessment above: Wife corroborates information above     Physical Exam   Triage Vital Signs: ED Triage Vitals  Encounter Vitals Group     BP 05/23/24 2345 (!) 152/102     Girls Systolic BP Percentile --      Girls Diastolic BP Percentile --      Boys Systolic BP Percentile --      Boys Diastolic BP Percentile --      Pulse Rate 05/23/24 2345 (!) 110     Resp 05/23/24 2345 18     Temp 05/23/24 2345 98.7 F (37.1 C)     Temp src --      SpO2 05/23/24 2345 98 %     Weight 05/23/24 2344 268 lb (121.6 kg)     Height 05/23/24 2344 6' 1 (1.854 m)     Head Circumference --      Peak Flow --      Pain Score 05/23/24 2344 6     Pain Loc --      Pain Education --      Exclude from Growth Chart --     Most recent vital signs: Vitals:   05/23/24 2345 05/24/24 0046  BP: (!) 152/102 (!) 150/99  Pulse: (!) 110 99  Resp: 18 18  Temp: 98.7 F (37.1 C) 98.1 F (36.7 C)  SpO2: 98% 100%    General: Awake, no distress.  CV:  Good peripheral perfusion.  Resp:  Normal effort.  Abd:  No distention.  Other:  There is a small healed abrasion to the right midfoot with surrounding cellulitic changes redness and warmth.  No obvious fluctuance or abscess, able to range.   ED Results /  Procedures / Treatments   Labs (all labs ordered are listed, but only abnormal results are displayed) Labs Reviewed  BASIC METABOLIC PANEL WITH GFR - Abnormal; Notable for the following components:      Result Value   Glucose, Bld 141 (*)    All other components within normal limits  CBC WITH DIFFERENTIAL/PLATELET  LACTIC ACID, PLASMA     I ordered and reviewed the above labs they are notable for no significant leukocytosis, no lactic.   PROCEDURES:  Critical Care performed: No  Procedures   MEDICATIONS ORDERED IN ED: Medications  amoxicillin -clavulanate (AUGMENTIN ) 875-125 MG per tablet 1 tablet (1 tablet Oral Given 05/24/24 0040)     IMPRESSION / MDM / ASSESSMENT AND PLAN / ED COURSE  I reviewed the triage vital signs and the nursing notes.                                Patient's  presentation is most consistent with acute presentation with potential threat to life or bodily function.  Differential diagnosis includes, but is not limited to, foot infection, abscess, considered but less likely sepsis, septic joint  MDM: Cellulitic change of the foot without obvious abscess for drainage, and no evidence of septic joint or sepsis at this time.  Will treat with Augmentin  first dose given in the emergency department.  Also requesting orthopedics referral for ongoing chronic unchanged shoulder pain from injury years ago he was referred for MRI but somehow this fell through.  I will give him contact information for orthopedics for follow-up.  I called the patient back after he left because we forgot to address tetanus, he is unsure of last tetanus shot but has an upcoming appointment with his primary doctor within the week and they will review his vaccination history and update as needed.  I considered hospitalization for admission or observation however given no evidence of sepsis, no drainable abscess, I think outpatient management is appropriate at this time.         FINAL CLINICAL IMPRESSION(S) / ED DIAGNOSES   Final diagnoses:  Dog bite of right foot, initial encounter  Cellulitis of right foot     Rx / DC Orders   ED Discharge Orders          Ordered    amoxicillin -clavulanate (AUGMENTIN ) 875-125 MG tablet  2 times daily        05/24/24 0042             Note:  This document was prepared using Dragon voice recognition software and may include unintentional dictation errors.    Cyrena Mylar, MD 05/24/24 [phone]

## 2024-05-24 NOTE — Discharge Instructions (Signed)
 Take Augmentin  antibiotic for the full 7-day course as prescribed.  Please keep your wound clean by washing at least daily with soap and water.  Apply antibiotic ointment daily. If you see any signs of infection like spreading redness, pus coming from the wound, extreme pain, fevers, chills or any other worsening doctor right away or come back to the emergency department  I gave you the phone number to call for shoulder specialist to further evaluate your shoulder injuries in order all relevant testing.  Thank you for choosing us  for your health care today!  Please see your primary doctor this week for a follow up appointment.   If you have any new, worsening, or unexpected symptoms call your doctor right away or come back to the emergency department for reevaluation.  It was my pleasure to care for you today.   Ginnie EDISON Cyrena, MD
